# Patient Record
Sex: Male | Born: 1995 | Race: White | Hispanic: No | Marital: Single | Smoking: Never smoker
Health system: Southern US, Community
[De-identification: ages and names within clinical notes are randomized; demographics above are authoritative.]

## PROBLEM LIST (undated history)

## (undated) DIAGNOSIS — F845 Asperger's syndrome: Secondary | ICD-10-CM

## (undated) DIAGNOSIS — F32A Depression, unspecified: Secondary | ICD-10-CM

## (undated) DIAGNOSIS — F329 Major depressive disorder, single episode, unspecified: Secondary | ICD-10-CM

## (undated) DIAGNOSIS — J302 Other seasonal allergic rhinitis: Secondary | ICD-10-CM

## (undated) DIAGNOSIS — F419 Anxiety disorder, unspecified: Secondary | ICD-10-CM

## (undated) HISTORY — PX: WISDOM TOOTH EXTRACTION: SHX21

---
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
## 2014-05-19 ENCOUNTER — Other Ambulatory Visit: Payer: Self-pay | Admitting: Family Medicine

## 2014-05-19 DIAGNOSIS — E041 Nontoxic single thyroid nodule: Secondary | ICD-10-CM

## 2014-06-04 ENCOUNTER — Ambulatory Visit
Admission: RE | Admit: 2014-06-04 | Discharge: 2014-06-04 | Disposition: A | Discharge status: Home / Self Care | Payer: Managed Care, Other (non HMO) | Source: Ambulatory Visit | Attending: Family Medicine | Admitting: Family Medicine

## 2014-06-04 DIAGNOSIS — E041 Nontoxic single thyroid nodule: Secondary | ICD-10-CM

## 2014-08-21 ENCOUNTER — Encounter (HOSPITAL_COMMUNITY): Payer: Self-pay

## 2014-08-21 ENCOUNTER — Emergency Department (HOSPITAL_COMMUNITY)
Admission: EM | Admit: 2014-08-21 | Discharge: 2014-08-23 | Disposition: A | Discharge status: Other Acute Inpatient Hospital | Payer: Managed Care, Other (non HMO) | Attending: Emergency Medicine | Admitting: Emergency Medicine

## 2014-08-21 DIAGNOSIS — F418 Other specified anxiety disorders: Secondary | ICD-10-CM | POA: Insufficient documentation

## 2014-08-21 DIAGNOSIS — F419 Anxiety disorder, unspecified: Secondary | ICD-10-CM | POA: Diagnosis not present

## 2014-08-21 DIAGNOSIS — F845 Asperger's syndrome: Secondary | ICD-10-CM | POA: Insufficient documentation

## 2014-08-21 DIAGNOSIS — F329 Major depressive disorder, single episode, unspecified: Secondary | ICD-10-CM | POA: Diagnosis not present

## 2014-08-21 DIAGNOSIS — N508 Other specified disorders of male genital organs: Secondary | ICD-10-CM | POA: Diagnosis not present

## 2014-08-21 DIAGNOSIS — F22 Delusional disorders: Secondary | ICD-10-CM | POA: Diagnosis not present

## 2014-08-21 DIAGNOSIS — R4689 Other symptoms and signs involving appearance and behavior: Secondary | ICD-10-CM | POA: Insufficient documentation

## 2014-08-21 DIAGNOSIS — Z8659 Personal history of other mental and behavioral disorders: Secondary | ICD-10-CM

## 2014-08-21 DIAGNOSIS — F32A Depression, unspecified: Secondary | ICD-10-CM | POA: Diagnosis present

## 2014-08-21 HISTORY — DX: Anxiety disorder, unspecified: F41.9

## 2014-08-21 HISTORY — DX: Other seasonal allergic rhinitis: J30.2

## 2014-08-21 HISTORY — DX: Depression, unspecified: F32.A

## 2014-08-21 HISTORY — DX: Asperger's syndrome: F84.5

## 2014-08-21 HISTORY — DX: Major depressive disorder, single episode, unspecified: F32.9

## 2014-08-21 LAB — URINALYSIS, ROUTINE W REFLEX MICROSCOPIC
BILIRUBIN URINE: NEGATIVE
Glucose, UA: NEGATIVE mg/dL
Hgb urine dipstick: NEGATIVE
Ketones, ur: NEGATIVE mg/dL
LEUKOCYTES UA: NEGATIVE
Nitrite: NEGATIVE
PH: 6.5 (ref 5.0–8.0)
Protein, ur: NEGATIVE mg/dL
Specific Gravity, Urine: 1.02 (ref 1.005–1.030)
UROBILINOGEN UA: 0.2 mg/dL (ref 0.0–1.0)

## 2014-08-21 NOTE — ED Notes (Addendum)
Pt has been camping with dad and family and reports that since the 5th he noticed the left testicle has become detached and the right side looks damaged. Took some advil yesterday and it helped. Mom wants him evaluated for psych. States there are some other things going on. Pt states he has been having problems with urinary issues for a month but didn't want to say anything. When it hurts he tries to forget about it. Pt has asperger's syndrome.

## 2014-08-21 NOTE — ED Notes (Signed)
Spoke with mother, she states there is nothing wrong with his testicles but wants him to be validated, he has been saying off the wall things since he got back from camp. States he has seen a new psychologist and new psychiatrist in May and was started on new medications but doesn't want to take them and they are concerned. States he told her his room is toxic and he called 911 and talked to a cop about it, which didn't really happen. She got really upset.

## 2014-08-21 NOTE — ED Notes (Signed)
TTS in progress 

## 2014-08-21 NOTE — BH Assessment (Signed)
Received notification of TTS consult request. Spoke to Tery SanfilippoMatthew Riester, MD who said Pt has a psychiatric history and appears to be having delusional thoughts. Tele-assessment will be initiated.  Harlin RainFord Ellis Patsy BaltimoreWarrick Jr, LPC, [facility], [facility] Triage Specialist [phone]

## 2014-08-21 NOTE — ED Provider Notes (Signed)
CSN: [id number]     Arrival date & time 08/21/14  1632 History   First MD Initiated Contact with Patient 08/21/14 1646     Chief Complaint  Patient presents with  . Testicle Pain  . Psychiatric Evaluation    per mother- please see RN notes    HPI Pt is a 19 yo male with a hx of anxiety, depression, and asperger syndrome presenting with testicular pain and concern for delusional thoughts per the parents.   Pt reports having left sided testicle pain and anejaculation for the past three weeks.  Parents report chronic masturbation and pt endorses no sexual contact with no hx of STI, penile discharge, or irritation otherwise.  Denies trauma to the testicles.  Denies fevers, chills, n/v, or abdominal pain.  Mother and father report pt has become delusional and is supposed to be on medications but refuses them.  Pt believes police came to his house and told him his room was toxic and how to clean his room.  Mother reports no such incident occurred.  Pt also believes a Public affairs consultant" has contacted him multiple times and told him he has cancer covering his body.  Mother worried at this point he could be a potential danger to other siblings.  Denies suicidal plan but endorses thoughts to harm others and thoughts of harm to self.  Endorses AVH but will not disclose hallucinations.  Denies drug abuse.   Past Medical History  Diagnosis Date  . Seasonal allergies   . Asperger syndrome   . Anxiety   . Depression    Past Surgical History  Procedure Laterality Date  . Wisdom tooth extraction     No family history on file. History  Substance Use Topics  . Smoking status: Never Smoker   . Smokeless tobacco: Not on file  . Alcohol Use: No    Review of Systems  Constitutional: Negative for fever and chills.  HENT: Negative for congestion and sore throat.   Eyes: Negative for pain.  Respiratory: Negative for cough and shortness of breath.   Cardiovascular: Negative for chest pain and palpitations.   Gastrointestinal: Negative for nausea, vomiting, abdominal pain and diarrhea.  Endocrine: Negative.   Genitourinary: Positive for testicular pain. Negative for dysuria, frequency, hematuria, flank pain, decreased urine volume, discharge, scrotal swelling, difficulty urinating, genital sores and penile pain.  Musculoskeletal: Negative for back pain and neck pain.  Skin: Negative for rash.  Allergic/Immunologic: Negative.   Neurological: Negative for dizziness, syncope and light-headedness.  Psychiatric/Behavioral: Positive for suicidal ideas. Negative for confusion and self-injury. The patient is nervous/anxious.       Allergies  Review of patient's allergies indicates no known allergies.  Home Medications   Prior to Admission medications   Medication Sig Start Date End Date Taking? Authorizing Provider  cetirizine (ZYRTEC) 10 MG tablet Take 10 mg by mouth daily as needed for allergies.   Yes Historical Provider, MD  tretinoin (RETIN-A) 0.025 % cream Apply 1 application topically at bedtime.  07/28/14   Historical Provider, MD   BP 106/77 mmHg  Pulse 82  Temp(Src) 97.8 F (36.6 C) (Oral)  Resp 16  Ht  (1.575 m)  Wt 105 lb (47.628 kg)  BMI 19.20 kg/m2  SpO2 100% Physical Exam  Constitutional: He is oriented to person, place, and time. He appears well-developed and well-nourished.  HENT:  Head: Normocephalic and atraumatic.  Eyes: Conjunctivae and EOM are normal. Pupils are equal, round, and reactive to light.  Neck: Normal range  of motion. Neck supple.  Cardiovascular: Normal rate, regular rhythm, normal heart sounds and intact distal pulses.   Pulmonary/Chest: Effort normal and breath sounds normal. No respiratory distress.  Abdominal: Soft. Bowel sounds are normal. There is no tenderness. Hernia confirmed negative in the right inguinal area and confirmed negative in the left inguinal area.  Genitourinary: Testes normal. Cremasteric reflex is present. Right testis shows  no mass, no swelling and no tenderness. Right testis is descended. Cremasteric reflex is not absent on the right side. Left testis shows no mass, no swelling and no tenderness. Left testis is descended. Cremasteric reflex is not absent on the left side. No penile erythema or penile tenderness. No discharge found.  Musculoskeletal: Normal range of motion.  Neurological: He is alert and oriented to person, place, and time. He has normal reflexes. No cranial nerve deficit.  Skin: Skin is warm and dry.  Psychiatric: His behavior is normal. His mood appears anxious. His speech is tangential. Thought content is delusional. Cognition and memory are normal. He expresses impulsivity and inappropriate judgment. He expresses no homicidal and no suicidal ideation. He expresses no suicidal plans and no homicidal plans.    ED Course  Procedures (including critical care time) Labs Review Labs Reviewed  URINE CULTURE  URINALYSIS, ROUTINE W REFLEX MICROSCOPIC (NOT AT [facility])    Imaging Review No results found.   EKG Interpretation None      MDM   Final diagnoses:  None    Pt is a 19 yo male with anxiety and aspergers presenting for testicular pain and concern for mental health.   On evaluation pt HDS in NAD.  Penis and testicles evaluated with no swelling, ulcerations, signs of trauma or tenderness.  Cremaster intact with no tenderness of epididymus.  Doubt STI, epididymitis, torsion, hydrocele, hernia.  Possible retrograde ejaculation due to symptoms of anejaculation with masturbation.  UA with no frank blood or signs of infection.  If symptoms continue may need evaluation by urology.    Pt with frank delusions on exam and concern for potential harm to siblings from mother.  SI and HI but no frank plans.  Discussed on phone with psych who recommended inpatient therapy and starting of medications.  Pt continued in the ED while awaiting placement.    If performed, labs, EKGs, and imaging were  reviewed/interpreted by myself and my attending and incorporated into medical decision making.  Discussed pertinent finding with patient or caregiver prior to admission with no further questions.  Pt care supervised by my attending Dr. Trecia RogersLockwood  Laelynn Blizzard, MD PGY-2  Emergency Medicine         Tery SanfilippoMatthew Bren Steers, MD 08/22/14 [id number]  Gerhard Munchobert Lockwood, MD 08/23/14 1807

## 2014-08-21 NOTE — ED Notes (Signed)
WANDED BY SECURITY AT THIS TIME

## 2014-08-21 NOTE — ED Notes (Signed)
Patient changing into wine colored scrubs and going to room C22.  Instructions regarding Pod C and visiting times given to Mother and Father.  Patient has been walking around in the room without difficulty.  Patient alert and understands we need to do all this to get him help.  Father and son agree on needing help.

## 2014-08-21 NOTE — BH Assessment (Addendum)
Tele Assessment Note   Jack Roy is an 19 y.o. male, single, white who presents to [facility] accompanied by both parents, who participated in assessment at [name] request. [name] parents report Pt has been diagnosed with Asperger's syndrome, anxiety and depression. Pt states he came to the ED because he was concerned his testicle had become detached. Pt describes having various somatic concerns  including that he has cancer throughout his body, that there is something wrong with his genitals, that his skull is cracked and that he has been exposed to toxins. Pt is concerned that he masturbates frequently and conflicted and worried about this. Pt says he called 911 and police came to the family home and told Pt there are toxins in the house, which parents say never happened. Pt also states that earlier today he threatened to kill himself with a knife, which mother states she is not sure actually happened. Pt acknowledges he has been very anxious, worried and "paranoid." Pt recently went on a family camping trip with a lot of people and says people told him things that disturbed him and things he wasn't sure he believed, such as there is going to be a video game which he will help create. Pt states he has been confused and sometimes doesn't know what is real. [name] parents report he has been pacing and talking to himself. Pt states he has frequent wide mood swings where he feels "scare and depressed" on moment and "happy or angry" the next. He reports crying spells, social withdrawal, irritability and frequently feeling fearful. He denies sleep problems but does say he has a problems with bed-wetting. He denies current suicidal ideation but acknowledged past suicidal thoughts with no intent. [name] parents say that Pt has verbally threatened suicide in the past but they do not think he would act on these thoughts and he makes these statement to emphasize how upset he is. Pt denies homicidal ideation and has no  history of violence. Pt denies auditory or visual hallucinations. Pt denies alcohol or substance abuse.  Pt lives with his parents, who are separated, a younger brother and an older sister. Pt reports he has frequently conflicts with with siblings. Pt is currently receiving outpatient medication management with Jack Oddi, PA-C and has received therapy in the past with Jack Roy, [facility] with [facility]. Parents and Pt cannot remember the names of the medications because Pt refuses to take them. Pt has not taken any psychiatric medications since May 2016. Pt took psychiatric medications throughout high school, from which he graduated, but recently has fear they would adversely affect him. Pt has no history of inpatient psychiatric treatment. Parents report a paternal family history of bipolar disorder, anxiety and depression.  Pt is dressed in hospital scrubs, alert, oriented x4 with soft speech and restless, fidgeting motor behavior. Eye contact is good. [name] mood is anxious, depressed and guilty; affect is congruent with mood. Thought process is coherent and circumstantial. Pt appears to have poor reality testing which is no baseline for Pt according to parents. Pt was cooperative and pleasant throughout assessment. Pt states he is willing to take psychiatric medications at this time and also willing to sign voluntarily into a psychiatric hospital as long as his parents are involved in treatment. [name] parents say the feel Pt would benefit from inpatient treatment.   Axis I: Unspecified Anxiety Disorder; Autism Spectrum Disorder Axis II: Deferred Axis III:  Past Medical History  Diagnosis Date  . Seasonal allergies   .  Asperger syndrome   . Anxiety   . Depression    Axis IV: other psychosocial or environmental problems Axis V: GAF=40  Past Medical History:  Past Medical History  Diagnosis Date  . Seasonal allergies   . Asperger syndrome   . Anxiety   . Depression     Past  Surgical History  Procedure Laterality Date  . Wisdom tooth extraction      Family History: No family history on file.  Social History:  reports that he has never smoked. He does not have any smokeless tobacco history on file. He reports that he does not drink alcohol or use illicit drugs.  Additional Social History:  Alcohol / Drug Use Pain Medications: Denies abuse Prescriptions: See MAR Over the Counter: Denies abuse History of alcohol / drug use?: No history of alcohol / drug abuse Longest period of sobriety (when/how long): NA  CIWA: CIWA-Ar BP: 125/69 mmHg Pulse Rate: 119 COWS:    PATIENT STRENGTHS: (choose at least two) Ability for insight Average or above average intelligence Metallurgist fund of knowledge Motivation for treatment/growth Physical Health Supportive family/friends  Allergies: No Known Allergies  Home Medications:  (Not in a hospital admission)  OB/GYN Status:  No LMP for male patient.  General Assessment Data Location of Assessment: [facility] ED TTS Assessment: In system Is this a Tele or Face-to-Face Assessment?: Tele Assessment Is this an Initial Assessment or a Re-assessment for this encounter?: Initial Assessment Marital status: Single Maiden name: NA Is patient pregnant?: No Pregnancy Status: No Living Arrangements: Parent, Other (Comment) (Parents, younger brother, older sister) Can pt return to current living arrangement?: Yes Admission Status: Voluntary Is patient capable of signing voluntary admission?: Yes Referral Source: Self/Family/Friend Insurance type: Community education officer     Crisis Care Plan Living Arrangements: Parent, Other (Comment) (Parents, younger brother, older sister) Name of Psychiatrist: Tamela Roy at [facility] Name of Therapist: "Jack Roy"  Education Status Is patient currently in school?: No Current Grade: NA Highest grade of school patient has completed: 12 Name of school: [employer] person: NA  Risk to self with the past 6 months Suicidal Ideation: No (Pt states he threatened to stab himself with a knife today) Has patient been a risk to self within the past 6 months prior to admission? : No Suicidal Intent: No Has patient had any suicidal intent within the past 6 months prior to admission? : Yes Is patient at risk for suicide?: No Suicidal Plan?: No Has patient had any suicidal plan within the past 6 months prior to admission? : Yes Access to Means: No What has been your use of drugs/alcohol within the last 12 months?: None Previous Attempts/Gestures: No How many times?: 0 Other Self Harm Risks: None identified Triggers for Past Attempts: None known Intentional Self Injurious Behavior: None Family Suicide History: No Recent stressful life event(s): Other (Comment) Biomedical scientist on camping trip) Persecutory voices/beliefs?: No Depression: Yes Depression Symptoms: Despondent, Tearfulness, Isolating, Guilt, Feeling angry/irritable Substance abuse history and/or treatment for substance abuse?: No Suicide prevention information given to non-admitted patients: Yes  Risk to Others within the past 6 months Homicidal Ideation: No Does patient have any lifetime risk of violence toward others beyond the six months prior to admission? : No Thoughts of Harm to Others: No Current Homicidal Intent: No Current Homicidal Plan: No Access to Homicidal Means: No Identified Victim: None History of harm to others?: No Assessment of Violence: None Noted Violent Behavior Description: None Does patient  have access to weapons?: No Criminal Charges Pending?: No Does patient have a court date: No Is patient on probation?: No  Psychosis Hallucinations: None noted Delusions: Somatic (See assessment note)  Mental Status Report Appearance/Hygiene: In scrubs Eye Contact: Fair Motor Activity: Restlessness Speech: Soft Level of Consciousness: Alert Mood: Anxious,  Depressed, Guilty Affect: Anxious Anxiety Level: Severe Thought Processes: Circumstantial, Coherent Judgement: Partial Orientation: Person, Place, Time, Situation, Appropriate for developmental age Obsessive Compulsive Thoughts/Behaviors: None  Cognitive Functioning Concentration: Normal Memory: Recent Intact, Remote Intact IQ: Average Insight: Poor Impulse Control: Fair Appetite: Good Weight Loss: 0 Weight Gain: 0 Sleep: No Change Total Hours of Sleep: 8 Vegetative Symptoms: None  ADLScreening [facility] Assessment Services) Patient's cognitive ability adequate to safely complete daily activities?: Yes Patient able to express need for assistance with ADLs?: Yes Independently performs ADLs?: Yes (appropriate for developmental age)  Prior Inpatient Therapy Prior Inpatient Therapy: No Prior Therapy Dates: NA Prior Therapy Facilty/Provider(s): NA Reason for Treatment: NA  Prior Outpatient Therapy Prior Outpatient Therapy: Yes Prior Therapy Dates: Current Prior Therapy Facilty/Provider(s): [facility] Reason for Treatment: Anxiety, depression Does patient have an ACCT team?: No Does patient have Intensive In-House Services?  : No Does patient have [facility] services? : No Does patient have P4CC services?: No  ADL Screening (condition at time of admission) Patient's cognitive ability adequate to safely complete daily activities?: Yes Is the patient deaf or have difficulty hearing?: No Does the patient have difficulty seeing, even when wearing glasses/contacts?: No Does the patient have difficulty concentrating, remembering, or making decisions?: No Patient able to express need for assistance with ADLs?: Yes Does the patient have difficulty dressing or bathing?: No Independently performs ADLs?: Yes (appropriate for developmental age) Does the patient have difficulty walking or climbing stairs?: No Weakness of Legs: None Weakness of Arms/Hands: None  Home Assistive  Devices/Equipment Home Assistive Devices/Equipment: None    Abuse/Neglect Assessment (Assessment to be complete while patient is alone) Physical Abuse: Denies Verbal Abuse: Denies Sexual Abuse: Denies Exploitation of patient/patient's resources: Denies Self-Neglect: Denies     Merchant navy officerAdvance Directives (For Healthcare) Does patient have an advance directive?: No Would patient like information on creating an advanced directive?: No - patient declined information    Additional Information 1:1 In Past 12 Months?: No CIRT Risk: No Elopement Risk: No Does patient have medical clearance?: Yes     Disposition: Consulted with Alberteen SamFran Hobson, NP who said Pt meets criteria for inpatient psychiatric crisis stabilization. Given [name] age and autism he would be better [facility] a [facility] and TTS will contact other facilities for placement. Notified Tery SanfilippoMatthew Riester, MD and Britta MccreedyBarbara, RN of recommendation. Discussed recommendation with Pt and [name] parents and they agree with recommendation. The Pt and parents also understand that placement may not be secure tonight and that Pt may need to wait overnight in the ED.  Disposition Initial Assessment Completed for this Encounter: Yes Disposition of Patient: Other dispositions Other disposition(s): Other (Comment)   Pamalee LeydenFord Ellis Brees Hounshell Jr, [facility], [facility], [facility] Triage Specialist [phone]   Pamalee LeydenWarrick Jr, Shonteria Abeln Ellis 08/21/2014 8:20 PM

## 2014-08-21 NOTE — ED Notes (Signed)
Belongings placed in utility room. One shirt, shorts, and sweatshirt. NO VALUABLES.

## 2014-08-22 DIAGNOSIS — F329 Major depressive disorder, single episode, unspecified: Secondary | ICD-10-CM | POA: Diagnosis not present

## 2014-08-22 DIAGNOSIS — F32A Depression, unspecified: Secondary | ICD-10-CM | POA: Diagnosis present

## 2014-08-22 DIAGNOSIS — Z8659 Personal history of other mental and behavioral disorders: Secondary | ICD-10-CM

## 2014-08-22 DIAGNOSIS — F419 Anxiety disorder, unspecified: Secondary | ICD-10-CM

## 2014-08-22 DIAGNOSIS — F845 Asperger's syndrome: Secondary | ICD-10-CM | POA: Diagnosis not present

## 2014-08-22 LAB — COMPREHENSIVE METABOLIC PANEL
ALK PHOS: 65 U/L (ref 38–126)
ALT: 19 U/L (ref 17–63)
AST: 18 U/L (ref 15–41)
Albumin: 4.4 g/dL (ref 3.5–5.0)
Anion gap: 10 (ref 5–15)
BILIRUBIN TOTAL: 0.6 mg/dL (ref 0.3–1.2)
BUN: 15 mg/dL (ref 6–20)
CALCIUM: 9.9 mg/dL (ref 8.9–10.3)
CO2: 24 mmol/L (ref 22–32)
Chloride: 106 mmol/L (ref 101–111)
Creatinine, Ser: 0.94 mg/dL (ref 0.61–1.24)
GLUCOSE: 85 mg/dL (ref 65–99)
POTASSIUM: 3.9 mmol/L (ref 3.5–5.1)
Sodium: 140 mmol/L (ref 135–145)
Total Protein: 7.6 g/dL (ref 6.5–8.1)

## 2014-08-22 LAB — CBG MONITORING, ED: Glucose-Capillary: 87 mg/dL (ref 65–99)

## 2014-08-22 LAB — ETHANOL: Alcohol, Ethyl (B): <5 mg/dL (ref <5)

## 2014-08-22 LAB — SALICYLATE LEVEL: Salicylate Lvl: <4 mg/dL (ref 2.8–30.0)

## 2014-08-22 LAB — CBC
HEMATOCRIT: 44.6 % (ref 39.0–52.0)
Hemoglobin: 16.3 g/dL (ref 13.0–17.0)
MCH: 32 pg (ref 26.0–34.0)
MCHC: 36.5 g/dL — AB (ref 30.0–36.0)
MCV: 87.6 fL (ref 78.0–100.0)
Platelets: 184 10*3/uL (ref 150–400)
RBC: 5.09 MIL/uL (ref 4.22–5.81)
RDW: 12.2 % (ref 11.5–15.5)
WBC: 6.2 10*3/uL (ref 4.0–10.5)

## 2014-08-22 LAB — ACETAMINOPHEN LEVEL: Acetaminophen (Tylenol), Serum: <10 ug/mL — ABNORMAL LOW (ref 10–30)

## 2014-08-22 MED ORDER — RISPERIDONE 1 MG PO TBDP
1.0000 mg | ORAL_TABLET | Freq: Once | ORAL | Status: AC
  Administered 2014-08-22: 1 mg via ORAL
  Filled 2014-08-22: qty 1

## 2014-08-22 NOTE — ED Notes (Signed)
[facility] - [phone].

## 2014-08-22 NOTE — ED Notes (Signed)
Per Renata Capriceonrad, NP, [facility], plan is to start pt on Risperdal, monitor results and look for placement at this time d/t mother had reported pt had voiced wanting to have "sex" w/his sister.

## 2014-08-22 NOTE — ED Notes (Addendum)
PER DEIDRA, [facility], PT [facility]. AWARE CONRAD, NP, [facility], HAS RECOMMENDED FOR PT TO BE IVC'D. DEIDRA ADVISED OK FOR PT TO BE TRANSPORTED IN AM IF [facility] DEPUTY UNABLE TO DO SO THIS EVENING AFTER PT HAS BEEN SERVED. DEIDRA REQUESTED FOR IVC PAPERS TO BE FAXED TO [facility] - [phone] WHEN SERVED. REPORT CAN BE CALLED TO [phone]. LEFT MESSAGE ON VM OF S[facility]AL W/[place] [facility] OFFICE ADVISING PT WILL NEED TO BE TRANSPORTED AFTER IVC PAPERWORK HAS BEEN SERVED. ADVISED STAFFING OFFICE OF NEED FOR SITTER.

## 2014-08-22 NOTE — ED Notes (Signed)
Patient up walking around in room at this time.

## 2014-08-22 NOTE — Consult Note (Signed)
Telepsych Consultation   Reason for Consult:  Obsessions, rumination, paranoid ideation, Asperger's exacerbation Referring Physician:  EDP Patient Identification: Jack Roy MRN:  [id number] Principal Diagnosis: Anxiety and depression Diagnosis:   Patient Active Problem List   Diagnosis Date Noted  . Anxiety and depression [F41.8] 08/22/2014    Priority: High  . History of Asperger's syndrome [Z86.59] 08/22/2014    Priority: High    Total Time spent with patient: 25 minutes  Subjective:   Jack Roy is a 19 y.o. male patient admitted with reports of obsessions about his testicles, toxins in the air, and the cleanliness of the house. Pt has reportedly refused his psychotropic medications (Tegretol and Lexapro). Pt has worsened since then.   Pt seen and chart reviewed. Pt reports that is is worried about the above factors. However, pt denies suicidal/homicidal ideation and psychosis. He does not appear to be responding to internal stimuli. However, he seems very anxious and asked about his testicular pain multiple times during the assessment.  Collateral from mother includes that pt is on Lexapro and Tegretol from a new provider because they could not get into an appointment with their previous provider, Dr. Jannifer Franklin. Mother reports chronic excessive masturbation. She reports that he dressed up one day and her 12-yr old daughter expressed concern that pt stated he "wanted to dress up so he can have sex" with his sister. She also reports that he is continues to exhibit paranoid ideation in that he has multiple somatic complaints about various things such as chest pain, testicular pain, "toxins in the air" and "poison in the food". Pt confirmed this when asked.  Pt was given a 1-time dose of Risperdal M-tabs 1mg  sublingual tab with no improvement in status. [name] mother is very concerned that she can no longer control him and that this made no change in his attitude or behavior when  she came to see him 3-4 hours later.  Due to pt's refusal to take medications at home, his increasing fixation on concerning topics such as inappropriately sexual endeavors with his 66 year old sister, delusional manifestations of fixation on hypochondriasis with no organic etiology, and history of running away when the car door opens at psychiatry appointments, pt warrants inpatient admission for safety and stabilization and must be involuntarily committed due to potential flight risk by history.    HPI:  Jack Roy is an 19 y.o. male, single, white who presents to [facility] accompanied by both parents, who participated in assessment at [name] request. [name] parents report Pt has been diagnosed with Asperger's syndrome, anxiety and depression. Pt states he came to the ED because he was concerned his testicle had become detached. Pt describes having various somatic concerns including that he has cancer throughout his body, that there is something wrong with his genitals, that his skull is cracked and that he has been exposed to toxins. Pt is concerned that he masturbates frequently and conflicted and worried about this. Pt says he called 911 and police came to the family home and told Pt there are toxins in the house, which parents say never happened. Pt also states that earlier today he threatened to kill himself with a knife, which mother states she is not sure actually happened. Pt acknowledges he has been very anxious, worried and "paranoid." Pt recently went on a family camping trip with a lot of people and says people told him things that disturbed him and things he wasn't sure he believed, such as there is going to be  a video game which he will help create. Pt states he has been confused and sometimes doesn't know what is real. [name] parents report he has been pacing and talking to himself. Pt states he has frequent wide mood swings where he feels "scare and depressed" on moment and "happy or angry"  the next. He reports crying spells, social withdrawal, irritability and frequently feeling fearful. He denies sleep problems but does say he has a problems with bed-wetting. He denies current suicidal ideation but acknowledged past suicidal thoughts with no intent. [name] parents say that Pt has verbally threatened suicide in the past but they do not think he would act on these thoughts and he makes these statement to emphasize how upset he is. Pt denies homicidal ideation and has no history of violence. Pt denies auditory or visual hallucinations. Pt denies alcohol or substance abuse.  Pt lives with his parents, who are separated, a younger brother and an older sister. Pt reports he has frequently conflicts with with siblings. Pt is currently receiving outpatient medication management with Tamela Oddi, PA-C and has received therapy in the past with Jory Ee, [facility] with [facility]. Parents and Pt cannot remember the names of the medications because Pt refuses to take them. Pt has not taken any psychiatric medications since May 2016. Pt took psychiatric medications throughout high school, from which he graduated, but recently has fear they would adversely affect him. Pt has no history of inpatient psychiatric treatment. Parents report a paternal family history of bipolar disorder, anxiety and depression.  Pt is dressed in hospital scrubs, alert, oriented x4 with soft speech and restless, fidgeting motor behavior. Eye contact is good. [name] mood is anxious, depressed and guilty; affect is congruent with mood. Thought process is coherent and circumstantial. Pt appears to have poor reality testing which is no baseline for Pt according to parents. Pt was cooperative and pleasant throughout assessment. Pt states he is willing to take psychiatric medications at this time and also willing to sign voluntarily into a psychiatric hospital as long as his parents are involved in treatment. [name] parents say the feel Pt  would benefit from inpatient treatment.  Past Medical History:  Past Medical History  Diagnosis Date  . Seasonal allergies   . Asperger syndrome   . Anxiety   . Depression     Past Surgical History  Procedure Laterality Date  . Wisdom tooth extraction     Family History: No family history on file. Social History:  History  Alcohol Use No     History  Drug Use No    History   Social History  . Marital Status: Single    Spouse Name: N/A  . Number of Children: N/A  . Years of Education: N/A   Social History Main Topics  . Smoking status: Never Smoker   . Smokeless tobacco: Not on file  . Alcohol Use: No  . Drug Use: No  . Sexual Activity: Not on file   Other Topics Concern  . None   Social History Narrative  . None   Additional Social History:    Pain Medications: Denies abuse Prescriptions: See MAR Over the Counter: Denies abuse History of alcohol / drug use?: No history of alcohol / drug abuse Longest period of sobriety (when/how long): NA                     Allergies:  No Known Allergies  Labs:  Results for orders placed or performed  during the hospital encounter of 08/21/14 (from the past 48 hour(s))  Urinalysis, Routine w reflex microscopic (not at [facility])     Status: None   Collection Time: 08/21/14  5:55 PM  Result Value Ref Range   Color, Urine YELLOW YELLOW   APPearance CLEAR CLEAR   Specific Gravity, Urine 1.020 1.005 - 1.030   pH 6.5 5.0 - 8.0   Glucose, UA NEGATIVE NEGATIVE mg/dL   Hgb urine dipstick NEGATIVE NEGATIVE   Bilirubin Urine NEGATIVE NEGATIVE   Ketones, ur NEGATIVE NEGATIVE mg/dL   Protein, ur NEGATIVE NEGATIVE mg/dL   Urobilinogen, UA 0.2 0.0 - 1.0 mg/dL   Nitrite NEGATIVE NEGATIVE   Leukocytes, UA NEGATIVE NEGATIVE    Comment: MICROSCOPIC NOT DONE ON URINES WITH NEGATIVE PROTEIN, BLOOD, LEUKOCYTES, NITRITE, OR GLUCOSE <1000 mg/dL.    Vitals: Blood pressure 93/57, pulse 78, temperature 98 F (36.7 C), temperature  source Oral, resp. rate 16, height 5\' 2"  (1.575 m), weight 47.628 kg (105 lb), SpO2 99 %.  Risk to Self: Suicidal Ideation: No (Pt states he threatened to stab himself with a knife today) Suicidal Intent: No Is patient at risk for suicide?: No Suicidal Plan?: No Access to Means: No What has been your use of drugs/alcohol within the last 12 months?: None How many times?: 0 Other Self Harm Risks: None identified Triggers for Past Attempts: None known Intentional Self Injurious Behavior: None Risk to Others: Homicidal Ideation: No Thoughts of Harm to Others: No Current Homicidal Intent: No Current Homicidal Plan: No Access to Homicidal Means: No Identified Victim: None History of harm to others?: No Assessment of Violence: None Noted Violent Behavior Description: None Does patient have access to weapons?: No Criminal Charges Pending?: No Does patient have a court date: No Prior Inpatient Therapy: Prior Inpatient Therapy: No Prior Therapy Dates: NA Prior Therapy Facilty/Provider(s): NA Reason for Treatment: NA Prior Outpatient Therapy: Prior Outpatient Therapy: Yes Prior Therapy Dates: Current Prior Therapy Facilty/Provider(s): [facility] Reason for Treatment: Anxiety, depression Does patient have an ACCT team?: No Does patient have Intensive In-House Services?  : No Does patient have [facility] services? : No Does patient have P4CC services?: No  No current facility-administered medications for this encounter.   Current Outpatient Prescriptions  Medication Sig Dispense Refill  . cetirizine (ZYRTEC) 10 MG tablet Take 10 mg by mouth daily as needed for allergies.    Marland Kitchen tretinoin (RETIN-A) 0.025 % cream Apply 1 application topically at bedtime.      Musculoskeletal:  UTO, camera   Psychiatric Specialty Exam: Physical Exam  Review of Systems  Musculoskeletal: Positive for myalgias (testicular pain; evaluated by ED and unremarkable).  Psychiatric/Behavioral: Positive for  hallucinations (delusions). Negative for suicidal ideas. The patient is nervous/anxious.   All other systems reviewed and are negative.   Blood pressure 93/57, pulse 78, temperature 98 F (36.7 C), temperature source Oral, resp. rate 16, height 5\' 2"  (1.575 m), weight 47.628 kg (105 lb), SpO2 99 %.Body mass index is 19.2 kg/(m^2).  General Appearance: Bizarre and Fairly Groomed  Patent attorney::  Poor  Speech:  Clear and Coherent and Pressured  Volume:  Increased  Mood:  Anxious and Irritable  Affect:  Non-Congruent, Inappropriate and Labile  Thought Process:  Circumstantial, Loose and Tangential  Orientation:  Full (Time, Place, and Person)  Thought Content:  Delusions, Paranoid Ideation and Rumination  Suicidal Thoughts:  No  Homicidal Thoughts:  No  Memory:  Immediate;   Fair Recent;   Fair Remote;  Fair  Judgement:  Impaired  Insight:  Lacking  Psychomotor Activity:  Increased  Concentration:  Fair  Recall:  Fis[employer]Fair  Fund of Knowledge:Fair  Language: Fair  Akathisia:  No  Handed:    AIMS (if indicated):     Assets:  Resilience Social Support  ADL's:  Impaired  Cognition: Impaired,  Moderate  Sleep:      Medical Decision Making: Established Problem, Stable/Improving (1), Review of Psycho-Social Stressors (1), Review or order clinical lab tests (1), Review of Medication Regimen & Side Effects (2) and Review of New Medication or Change in Dosage (2)  Treatment Plan Summary: Anxiety and depression with current Asperger's diagnosis, unstable, warrants inpatient admission.    Plan:  Recommend psychiatric Inpatient admission when medically cleared.  Disposition:  -Admit to inpatient psychiatric hospitalization for safety and stabilization  Beau FannyWithrow, John C, FNP-BC 08/22/2014 10:04 AM  Reviewed case and agree with plan.

## 2014-08-22 NOTE — ED Notes (Signed)
IVC PAPERS SERVED  

## 2014-08-22 NOTE — ED Notes (Signed)
Pt has been ambulating to bathroom and back to room several times this am. Pt noted to lock the door during one episode - RN asked pt to not do so. Pt voiced understanding and stated, "There shouldn't be a lock on the door if you're not supposed to lock it". Pt did as requested.

## 2014-08-22 NOTE — ED Provider Notes (Signed)
Patient has been accepted to an outside facility [facility]). Psych is concerned about patient requests that he be involuntarily committed. There have been some reports that there is concern from family that patient is talking about having sex with his underage sister.  Pricilla LovelessScott Hercules Hasler, MD 08/22/14 [phone]

## 2014-08-22 NOTE — ED Notes (Signed)
IVC paperwork given to Kim,Janan Halter Secretary, for [employer].

## 2014-08-22 NOTE — Progress Notes (Signed)
Disposition CSW completed referrals to the following inpatient psych facilities:  [facility]  CSW will follow patient for their placement needs.  Seward SpeckLeo Tristin Vandeusen [facility]oscopyCSW,LCAS Behavioral Health Disposition CSW [phone]

## 2014-08-22 NOTE — ED Notes (Signed)
Dr Criss AlvineGoldston aware no labs had been ordered/drawn for pt [facility]. Simonne ComeLeo, SW, to call Dagoberto Reefeidra, [facility], to advise labwork being drawn and RN will fax results when available.

## 2014-08-22 NOTE — ED Notes (Signed)
SPOKE Lanette HampshireW/KRISTY, [facility] -- [phone] - ADVISED COPY OF IVC PAPERWORK FAXED TO THEM [phone]- [phone] AND ADVISED PT WILL NOT BE TRANSPORTED UNTIL AM. KRISTY ADVISED OK - REQUESTED FOR REPORT TO BE CALLED IN AM TO [phone].

## 2014-08-22 NOTE — ED Notes (Signed)
[facility] [phone] - requesting if pt is his own legal guardian or if parents are and how is pt going to get there. Simonne ComeLeo, SW, aware and is speaking w/parents.

## 2014-08-22 NOTE — ED Notes (Signed)
[facility] Copy placed in Medical Records.

## 2014-08-22 NOTE — ED Provider Notes (Signed)
Psych recommends 1 mg Risperdal Mtab now to see if this helps patient. Will continue to observe.  Pricilla LovelessScott Aurelie Dicenzo, MD 08/22/14 [phone]

## 2014-08-22 NOTE — ED Notes (Signed)
[facility] REQUESTED TO BE CALLED WHEN RECEIVE IVC PAPERS - [phone].

## 2014-08-22 NOTE — ED Notes (Signed)
Verified w/magistrate received IVC paperwork - Magistrate advised will have [employer] to serve.

## 2014-08-22 NOTE — ED Notes (Signed)
RN CALLED [name] MOTHER, LAURA, AND ADVISED PT BEING TRANSPORTED IN AM TO [facility]. VOICED UNDERSTANDING AND AGREEMENT W/PLAN. ADVISED SHE WILL BE HERE FOR 0830 VISIT IN AM.

## 2014-08-23 LAB — URINE CULTURE: Culture: 9000

## 2014-08-23 NOTE — ED Notes (Signed)
Patient is asleep with sitter at the bedside. 

## 2015-03-04 ENCOUNTER — Encounter (HOSPITAL_COMMUNITY): Payer: Self-pay | Admitting: Emergency Medicine

## 2015-03-04 ENCOUNTER — Emergency Department (HOSPITAL_COMMUNITY)
Admission: EM | Admit: 2015-03-04 | Discharge: 2015-03-05 | Disposition: A | Discharge status: Home / Self Care | Payer: Managed Care, Other (non HMO) | Attending: Emergency Medicine | Admitting: Emergency Medicine

## 2015-03-04 DIAGNOSIS — Z8659 Personal history of other mental and behavioral disorders: Secondary | ICD-10-CM | POA: Insufficient documentation

## 2015-03-04 DIAGNOSIS — R440 Auditory hallucinations: Secondary | ICD-10-CM

## 2015-03-04 DIAGNOSIS — R45851 Suicidal ideations: Secondary | ICD-10-CM | POA: Diagnosis present

## 2015-03-04 LAB — CBC
HCT: 41.3 % (ref 39.0–52.0)
HEMOGLOBIN: 14.5 g/dL (ref 13.0–17.0)
MCH: 31.5 pg (ref 26.0–34.0)
MCHC: 35.1 g/dL (ref 30.0–36.0)
MCV: 89.8 fL (ref 78.0–100.0)
PLATELETS: 209 10*3/uL (ref 150–400)
RBC: 4.6 MIL/uL (ref 4.22–5.81)
RDW: 12.3 % (ref 11.5–15.5)
WBC: 6.5 10*3/uL (ref 4.0–10.5)

## 2015-03-04 LAB — COMPREHENSIVE METABOLIC PANEL
ALT: 11 U/L — ABNORMAL LOW (ref 17–63)
ANION GAP: 11 (ref 5–15)
AST: 16 U/L (ref 15–41)
Albumin: 4.5 g/dL (ref 3.5–5.0)
Alkaline Phosphatase: 61 U/L (ref 38–126)
BUN: 20 mg/dL (ref 6–20)
CHLORIDE: 110 mmol/L (ref 101–111)
CO2: 22 mmol/L (ref 22–32)
Calcium: 9.7 mg/dL (ref 8.9–10.3)
Creatinine, Ser: 1.05 mg/dL (ref 0.61–1.24)
Glucose, Bld: 96 mg/dL (ref 65–99)
POTASSIUM: 3.7 mmol/L (ref 3.5–5.1)
SODIUM: 143 mmol/L (ref 135–145)
Total Bilirubin: 0.6 mg/dL (ref 0.3–1.2)
Total Protein: 7.6 g/dL (ref 6.5–8.1)

## 2015-03-04 LAB — RAPID URINE DRUG SCREEN, HOSP PERFORMED
AMPHETAMINES: NOT DETECTED
BENZODIAZEPINES: NOT DETECTED
Barbiturates: NOT DETECTED
COCAINE: NOT DETECTED
OPIATES: NOT DETECTED
Tetrahydrocannabinol: NOT DETECTED

## 2015-03-04 NOTE — ED Notes (Signed)
Patient is having hallucinations from their pet dog. The pet dog is telling him to do stuff. Patient is not saying what stuff.

## 2015-03-05 DIAGNOSIS — R440 Auditory hallucinations: Secondary | ICD-10-CM | POA: Diagnosis not present

## 2015-03-05 LAB — ACETAMINOPHEN LEVEL

## 2015-03-05 LAB — SALICYLATE LEVEL

## 2015-03-05 LAB — ETHANOL

## 2015-03-05 MED ORDER — LORAZEPAM 1 MG PO TABS
1.0000 mg | ORAL_TABLET | Freq: Three times a day (TID) | ORAL | Status: DC | PRN
  Administered 2015-03-05: 1 mg via ORAL
  Filled 2015-03-05: qty 1

## 2015-03-05 MED ORDER — ONDANSETRON HCL 4 MG PO TABS: 4.0000 mg | ORAL_TABLET | Freq: Three times a day (TID) | ORAL | Status: DC | PRN

## 2015-03-05 MED ORDER — ACETAMINOPHEN 325 MG PO TABS: 650.0000 mg | ORAL_TABLET | ORAL | Status: DC | PRN

## 2015-03-05 NOTE — ED Provider Notes (Signed)
CSN: [id number]     Arrival date & time 03/04/15  2240 History   First MD Initiated Contact with Patient 03/04/15 2330     Chief Complaint  Patient presents with  . Homicidal  . Suicidal     (Consider location/radiation/quality/duration/timing/severity/associated sxs/prior Treatment) HPI Comments: Patient with history of Aspergers, depression, anxiety, followed by [facility], presents with auditory hallucinations the patient refers to as "telepathy" with command to call 9-1-1 for emergency assistance. Per mom who is at bedside, the patient has history of AH, and is undergoing medication adjustments by his psychiatrist in an attempt to establish clear diagnoses. The patient denies SI/HI.   The history is provided by the patient and a parent. No language interpreter was used.    Past Medical History  Diagnosis Date  . Seasonal allergies   . Asperger syndrome   . Anxiety   . Depression    Past Surgical History  Procedure Laterality Date  . Wisdom tooth extraction     History reviewed. No pertinent family history. Social History  Substance Use Topics  . Smoking status: Never Smoker   . Smokeless tobacco: None  . Alcohol Use: No    Review of Systems  Constitutional: Negative for fever and chills.  HENT: Negative.   Respiratory: Negative.   Cardiovascular: Negative.   Gastrointestinal: Negative.   Musculoskeletal: Negative.   Skin: Negative.   Neurological: Negative.   Psychiatric/Behavioral: Positive for hallucinations.      Allergies  Review of patient's allergies indicates no known allergies.  Home Medications   Prior to Admission medications   Medication Sig Start Date End Date Taking? Authorizing Provider  cetirizine (ZYRTEC) 10 MG tablet Take 10 mg by mouth daily as needed for allergies.    Historical Provider, MD  tretinoin (RETIN-A) 0.025 % cream Apply 1 application topically at bedtime.  07/28/14   Historical Provider, MD   BP 116/78 mmHg   Pulse 77  Temp(Src) 98.4 F (36.9 C) (Oral)  Resp 16  SpO2 100% Physical Exam  Constitutional: He is oriented to person, place, and time. He appears well-developed and well-nourished.  HENT:  Head: Normocephalic.  Neck: Normal range of motion. Neck supple.  Cardiovascular: Normal rate and regular rhythm.   Pulmonary/Chest: Effort normal and breath sounds normal.  Abdominal: Soft. Bowel sounds are normal. There is no tenderness. There is no rebound and no guarding.  Musculoskeletal: Normal range of motion.  Neurological: He is alert and oriented to person, place, and time.  Skin: Skin is warm and dry. No rash noted.  Psychiatric: His speech is normal. He is actively hallucinating. He expresses impulsivity.    ED Course  Procedures (including critical care time) Labs Review Labs Reviewed  COMPREHENSIVE METABOLIC PANEL - Abnormal; Notable for the following:    ALT 11 (*)    All other components within normal limits  ACETAMINOPHEN LEVEL - Abnormal; Notable for the following:    Acetaminophen (Tylenol), Serum <10 (*)    All other components within normal limits  ETHANOL  SALICYLATE LEVEL  CBC  URINE RAPID DRUG SCREEN, HOSP PERFORMED    Imaging Review No results found. I have personally reviewed and evaluated these images and lab results as part of my medical decision-making.   EKG Interpretation None      MDM   Final diagnoses:  None    1. Auditory hallucinations  Discussed evaluation by TTS tonight with possibility of inpatient vs outpatient. Will require TTS input to determine disposition.  Elpidio Anis, PA-C 03/05/15 0011  Mancel Bale, MD 03/05/15 1322

## 2015-03-05 NOTE — Consult Note (Signed)
[facility] Consult   Reason for Consult:  Psychiatric Consult Referring Physician:  EDP Patient Identification: Jack Roy MRN:  [id number] Principal Diagnosis: Auditory hallucinations Diagnosis:   Patient Active Problem List   Diagnosis Date Noted  . Auditory hallucinations [R44.0] 03/05/2015  . Anxiety and depression [F41.8] 08/22/2014  . History of Asperger's syndrome [Z86.59] 08/22/2014    Total Time spent with patient: 35 minutes  Subjective:   Jack Roy is a 20 y.o. male patient admitted with auditory hallucinations.Marland Kitchen  HPI:  Jack Roy is a 20 yo Caucasian male who presented to [facility] ED voluntarily for evaluation of auditory hallucinations which he described as his pet dog giving him commands. He does not elaborate on these commands.  Today, he is seen by Dr. Darleene Cleaver and Manus Gunning, NP. His father is present for the evaluation. The patient reports that he continues to hear voices and that the voices tell him not to tell others what he is hearing. He denies suicidal or homicidal ideation, intent or plan. He denies previous suicide attempts.  He is currently on zyprexa, trileptal, and risperdal. His father administers his medication and states the patient is usually compliant with medications except for the past 3 days. He is followed by Eino Farber, PA at [facility]. He denies alcohol or drug use.   Past Psychiatric History: Anxiety, depression, Asperger's  Risk to Self: Suicidal Ideation: No Suicidal Intent: No Is patient at risk for suicide?: No Suicidal Plan?: No Access to Means: No What has been your use of drugs/alcohol within the last 12 months?: Pt denies  How many times?: 0 Other Self Harm Risks: PT denies  Triggers for Past Attempts: None known Intentional Self Injurious Behavior: None Risk to Others: Homicidal Ideation: No Thoughts of Harm to Others: No Current Homicidal Intent: No Current Homicidal Plan: No Access to  Homicidal Means: No Identified Victim: N/A History of harm to others?: No Assessment of Violence: None Noted Violent Behavior Description: No violent behaviors observed.  Does patient have access to weapons?: No Criminal Charges Pending?: No Does patient have a court date: No Prior Inpatient Therapy: Prior Inpatient Therapy: No Prior Outpatient Therapy: Prior Outpatient Therapy: Yes Prior Therapy Dates: Current  Prior Therapy Facilty/Provider(s): [facility]  Reason for Treatment: Medication management  Does patient have an ACCT team?: No Does patient have Intensive In-House Services?  : No Does patient have [facility] services? : No Does patient have P4CC services?: No  Past Medical History:  Past Medical History  Diagnosis Date  . Seasonal allergies   . Asperger syndrome   . Anxiety   . Depression     Past Surgical History  Procedure Laterality Date  . Wisdom tooth extraction     Family History: History reviewed. No pertinent family history. Family Psychiatric  History: Unknown Social History:  History  Alcohol Use No     History  Drug Use No    Social History   Social History  . Marital Status: Single    Spouse Name: N/A  . Number of Children: N/A  . Years of Education: N/A   Social History Main Topics  . Smoking status: Never Smoker   . Smokeless tobacco: None  . Alcohol Use: No  . Drug Use: No  . Sexual Activity: Not Asked   Other Topics Concern  . None   Social History Narrative   Additional Social History:    History of alcohol / drug use?: No history of alcohol / drug abuse  Allergies:  No Known Allergies  Labs:  Results for orders placed or performed during the hospital encounter of 03/04/15 (from the past 48 hour(s))  Comprehensive metabolic panel     Status: Abnormal   Collection Time: 03/04/15 10:57 PM  Result Value Ref Range   Sodium 143 135 - 145 mmol/L   Potassium 3.7 3.5 - 5.1 mmol/L   Chloride 110 101 - 111 mmol/L    CO2 22 22 - 32 mmol/L   Glucose, Bld 96 65 - 99 mg/dL   BUN 20 6 - 20 mg/dL   Creatinine, Ser 1.05 0.61 - 1.24 mg/dL   Calcium 9.7 8.9 - 10.3 mg/dL   Total Protein 7.6 6.5 - 8.1 g/dL   Albumin 4.5 3.5 - 5.0 g/dL   AST 16 15 - 41 U/L   ALT 11 (L) 17 - 63 U/L   Alkaline Phosphatase 61 38 - 126 U/L   Total Bilirubin 0.6 0.3 - 1.2 mg/dL   GFR calc non Af Amer >60 >60 mL/min   GFR calc Af Amer >60 >60 mL/min    Comment: (NOTE) The eGFR has been calculated using the CKD EPI equation. This calculation has not been validated in all clinical situations. eGFR's persistently <60 mL/min signify possible Chronic Kidney Disease.    Anion gap 11 5 - 15  Ethanol (ETOH)     Status: None   Collection Time: 03/04/15 10:57 PM  Result Value Ref Range   Alcohol, Ethyl (B) <5 <5 mg/dL    Comment:        LOWEST DETECTABLE LIMIT FOR SERUM ALCOHOL IS 5 mg/dL FOR MEDICAL PURPOSES ONLY   Salicylate level     Status: None   Collection Time: 03/04/15 10:57 PM  Result Value Ref Range   Salicylate Lvl <0.1 2.8 - 30.0 mg/dL  Acetaminophen level     Status: Abnormal   Collection Time: 03/04/15 10:57 PM  Result Value Ref Range   Acetaminophen (Tylenol), Serum <10 (L) 10 - 30 ug/mL    Comment:        THERAPEUTIC CONCENTRATIONS VARY SIGNIFICANTLY. A RANGE OF 10-30 ug/mL MAY BE AN EFFECTIVE CONCENTRATION FOR MANY PATIENTS. HOWEVER, SOME ARE BEST TREATED AT CONCENTRATIONS OUTSIDE THIS RANGE. ACETAMINOPHEN CONCENTRATIONS >150 ug/mL AT 4 HOURS AFTER INGESTION AND >50 ug/mL AT 12 HOURS AFTER INGESTION ARE OFTEN ASSOCIATED WITH TOXIC REACTIONS.   CBC     Status: None   Collection Time: 03/04/15 10:57 PM  Result Value Ref Range   WBC 6.5 4.0 - 10.5 K/uL   RBC 4.60 4.22 - 5.81 MIL/uL   Hemoglobin 14.5 13.0 - 17.0 g/dL   HCT 41.3 39.0 - 52.0 %   MCV 89.8 78.0 - 100.0 fL   MCH 31.5 26.0 - 34.0 pg   MCHC 35.1 30.0 - 36.0 g/dL   RDW 12.3 11.5 - 15.5 %   Platelets 209 150 - 400 K/uL  Urine rapid drug  screen (hosp performed) (Not at [facility])     Status: None   Collection Time: 03/04/15 11:13 PM  Result Value Ref Range   Opiates NONE DETECTED NONE DETECTED   Cocaine NONE DETECTED NONE DETECTED   Benzodiazepines NONE DETECTED NONE DETECTED   Amphetamines NONE DETECTED NONE DETECTED   Tetrahydrocannabinol NONE DETECTED NONE DETECTED   Barbiturates NONE DETECTED NONE DETECTED    Comment:        DRUG SCREEN FOR MEDICAL PURPOSES ONLY.  IF CONFIRMATION IS NEEDED FOR ANY PURPOSE, NOTIFY LAB WITHIN 5 DAYS.  LOWEST DETECTABLE LIMITS FOR URINE DRUG SCREEN Drug Class       Cutoff (ng/mL) Amphetamine      1000 Barbiturate      200 Benzodiazepine   846 Tricyclics       962 Opiates          300 Cocaine          300 THC              50     Current Facility-Administered Medications  Medication Dose Route Frequency Provider Last Rate Last Dose  . acetaminophen (TYLENOL) tablet 650 mg  650 mg Oral Q4H PRN Charlann Lange, PA-C      . LORazepam (ATIVAN) tablet 1 mg  1 mg Oral Q8H PRN Charlann Lange, PA-C   1 mg at 03/05/15 [id number]  . ondansetron (ZOFRAN) tablet 4 mg  4 mg Oral Q8H PRN Charlann Lange, PA-C       Current Outpatient Prescriptions  Medication Sig Dispense Refill  . OLANZapine (ZYPREXA) 5 MG tablet Take 10 mg by mouth at bedtime.     . Oxcarbazepine (TRILEPTAL) 300 MG tablet Take 300 mg by mouth at bedtime.     . risperiDONE (RISPERDAL) 1 MG tablet Take 0.5-2 mg by mouth 2 (two) times daily. 0.5 mg AM and 2 mg HS    . cetirizine (ZYRTEC) 10 MG tablet Take 10 mg by mouth daily as needed for allergies.    Marland Kitchen tretinoin (RETIN-A) 0.025 % cream Apply 1 application topically at bedtime.       Musculoskeletal: Strength & Muscle Tone: within normal limits Gait & Station: normal Patient leans: N/A  Psychiatric Specialty Exam: Review of Systems  Constitutional: Negative.   HENT: Negative.   Eyes: Negative.   Respiratory: Negative.   Cardiovascular: Negative.   Gastrointestinal:  Negative.   Genitourinary: Negative.   Musculoskeletal: Negative.   Skin: Negative.   Neurological: Negative.   Endo/Heme/Allergies: Negative.   Psychiatric/Behavioral: Positive for hallucinations. The patient is nervous/anxious.     Blood pressure 126/69, pulse 88, temperature 97.5 F (36.4 C), temperature source Oral, resp. rate 20, SpO2 100 %.There is no weight on file to calculate BMI.  General Appearance: Casual  Eye Contact::  Fair  Speech:  Blocked  Volume:  Decreased  Mood:  Anxious  Affect:  Congruent  Thought Process:  Circumstantial  Orientation:  Full (Time, Place, and Person)  Thought Content:  Hallucinations: Auditory  Suicidal Thoughts:  No  Homicidal Thoughts:  No  Memory:  Immediate;   Fair Recent;   Fair Remote;   Fair  Judgement:  Fair  Insight:  Lacking  Psychomotor Activity:  Normal  Concentration:  Fair  Recall:  [employer] of Knowledge:Fair  Language: Fair  Akathisia:  No  Handed:  Right  AIMS (if indicated):     Assets:  Desire for Improvement Housing Physical Health Social Support  ADL's:  Intact  Cognition: Impaired,  Mild  Sleep:      Disposition: Patient's case discussed with Dr. Darleene Cleaver. At present, patient exhibits no evidence of imminent risk to himself or others. He does not meet criteria for psychiatric inpatient admission. Patient has an appointment with his outpatient provider on Monday. Both father and patient agree to keep this follow up appointment. He will be discharged home and with outpatient follow up.   Serena Colonel, FNP-BC [place] 03/05/2015 11:33 AM Patient seen face-to-face for psychiatric evaluation, chart reviewed and case discussed with the physician extender and  developed treatment plan. Reviewed the information documented and agree with the treatment plan. Corena Pilgrim, MD

## 2015-03-05 NOTE — ED Notes (Signed)
Patient states he is here because he called 911 when he was not suppose to. Patient oriented to unit. Patient denies SI, HI and AVH at this time. Encouragement and support provided and safety maintain. Q 15 min safety checks in place.

## 2015-03-05 NOTE — BHH Suicide Risk Assessment (Signed)
Suicide Risk Assessment  Discharge Assessment   [facility] Discharge Suicide Risk Assessment   Principal Problem: Auditory hallucinations Discharge Diagnoses:  Patient Active Problem List   Diagnosis Date Noted  . Auditory hallucinations [R44.0] 03/05/2015  . Anxiety and depression [F41.8] 08/22/2014  . History of Asperger's syndrome [Z86.59] 08/22/2014    Total Time spent with patient: 20 minutes  Musculoskeletal: Strength & Muscle Tone: within normal limits Gait & Station: normal Patient leans: N/A  Psychiatric Specialty Exam: Blood pressure 126/69, pulse 88, temperature 97.5 F (36.4 C), temperature source Oral, resp. rate 20, SpO2 100 %.There is no weight on file to calculate BMI.  General Appearance: Casual  Eye Contact::  Fair  Speech:  Blocked  Volume:  Decreased  Mood:  Anxious  Affect:  Congruent  Thought Process:  Circumstantial  Orientation:  Full (Time, Place, and Person)  Thought Content:  Hallucinations: Auditory  Suicidal Thoughts:  No  Homicidal Thoughts:  No  Memory:  Immediate;   Fair Recent;   Fair Remote;   Fair  Judgement:  Fair  Insight:  Lacking  Psychomotor Activity:  Normal  Concentration:  Fair  Recall:  [employer] of Knowledge:Fair  Language: Fair  Akathisia:  No  Handed:  Right  AIMS (if indicated):     Assets:  Desire for Improvement Housing Physical Health Social Support  ADL's:  Intact  Cognition: Impaired,  Mild  Sleep:      Mental Status Per Nursing Assessment::   On Admission:  Alert with auditory hallucinations  Demographic Factors:  Male, Adolescent or young adult, Caucasian and Unemployed  Loss Factors: NA  Historical Factors: NA  Risk Reduction Factors:   Living with another person, especially a relative, Positive social support and Positive therapeutic relationship  Continued Clinical Symptoms:  Asperger's  Cognitive Features That Contribute To Risk:  Asperger's   Suicide Risk:  Minimal: No identifiable  suicidal ideation.  Patients presenting with no risk factors but with morbid ruminations; may be classified as minimal risk based on the severity of the depressive symptoms    Plan Of Care/Follow-up recommendations:  Activity:  As tolerated  Diet:  Regular Tests:  As determined by PCP or outpatient provider Other:  Keep follow up appointment with Tamela Oddi  1.Take all your medications as prescribed.  2. Report any adverse side effects to your medication to your outpatient provider. 3. Do not use alcohol or illegal drugs while taking prescription medications. 4. In the event of worsening symptoms, call 911, the crisis hotline or go to nearest emergency room for evaluation of symptoms.  Alberteen Sam, FNP-BC [facility] 03/05/2015, 11:34 AM

## 2015-03-05 NOTE — BH Assessment (Addendum)
Tele Assessment Note   Jack Roy is an 20 y.o. male  presenting to [facility] after contacting 911. Pt stated "I am here because I called 911".Pt denies SI and HI at this time. Pt did not report any previous suicide attempts but shared that he has had thoughts in the past. Pt reported that he is currently experiencing auditory hallucinations. He stated "some help me and some harm me". Pt appears to be responding to internal stimuli at this time. Pt is currently under the care of a psychiatrist at this time and was hospitalized in the past. Pt did not report any alcohol or illicit substance abuse. Pt did not report any physical, sexual or emotional abuse at this time.  Inpatient treatment is recommended.   Diagnosis: Major Depressive Disorder, Recurrent episode, with psychotic features   Past Medical History:  Past Medical History  Diagnosis Date  . Seasonal allergies   . Asperger syndrome   . Anxiety   . Depression     Past Surgical History  Procedure Laterality Date  . Wisdom tooth extraction      Family History: History reviewed. No pertinent family history.  Social History:  reports that he has never smoked. He does not have any smokeless tobacco history on file. He reports that he does not drink alcohol or use illicit drugs.  Additional Social History:  Alcohol / Drug Use History of alcohol / drug use?: No history of alcohol / drug abuse  CIWA: CIWA-Ar BP: 116/78 mmHg Pulse Rate: 77 COWS:    PATIENT STRENGTHS: (choose at least two) Average or above average intelligence Supportive family/friends  Allergies: No Known Allergies  Home Medications:  (Not in a hospital admission)  OB/GYN Status:  No LMP for male patient.  General Assessment Data [facility] of Assessment: [facility] TTS Assessment: In system Is this a Tele or Face-to-Face Assessment?: Face-to-Face Is this an Initial Assessment or a Re-assessment for this encounter?: Initial Assessment Marital status:  Single Living Arrangements: Parent Can pt return to current living arrangement?: Yes Admission Status: Voluntary Is patient capable of signing voluntary admission?: Yes Referral Source: Self/Family/Friend Insurance type: Community education officer     Crisis Care Plan Living Arrangements: Parent Name of Psychiatrist: [facility] Name of Therapist: [facility]  Education Status Is patient currently in school?: No Current Grade: N/A Highest grade of school patient has completed: N/A Name of school: N/A Contact person: N/A  Risk to self with the past 6 months Suicidal Ideation: No Has patient been a risk to self within the past 6 months prior to admission? : No Suicidal Intent: No Has patient had any suicidal intent within the past 6 months prior to admission? : No Is patient at risk for suicide?: No Suicidal Plan?: No Has patient had any suicidal plan within the past 6 months prior to admission? : No Access to Means: No What has been your use of drugs/alcohol within the last 12 months?: Pt denies  Previous Attempts/Gestures: No How many times?: 0 Other Self Harm Risks: PT denies  Triggers for Past Attempts: None known Intentional Self Injurious Behavior: None Family Suicide History: No Recent stressful life event(s):  (PT denies ) Persecutory voices/beliefs?: No Depression: Yes Depression Symptoms: Tearfulness, Fatigue, Loss of interest in usual pleasures, Guilt, Feeling worthless/self pity Substance abuse history and/or treatment for substance abuse?: No Suicide prevention information given to non-admitted patients: Not applicable  Risk to Others within the past 6 months Homicidal Ideation: No Does patient have any lifetime risk of violence  toward others beyond the six months prior to admission? : No Thoughts of Harm to Others: No Current Homicidal Intent: No Current Homicidal Plan: No Access to Homicidal Means: No Identified Victim: N/A History of harm to others?:  No Assessment of Violence: None Noted Violent Behavior Description: No violent behaviors observed.  Does patient have access to weapons?: No Criminal Charges Pending?: No Does patient have a court date: No Is patient on probation?: No  Psychosis Hallucinations: Auditory, With command Delusions: None noted  Mental Status Report Appearance/Hygiene: In scrubs Eye Contact: Poor Motor Activity: Freedom of movement Speech: Soft Level of Consciousness: Quiet/awake Mood: Euthymic Affect: Blunted Anxiety Level: Minimal Thought Processes: Coherent, Relevant Judgement: Unimpaired Orientation: Appropriate for developmental age Obsessive Compulsive Thoughts/Behaviors: None  Cognitive Functioning Concentration: Decreased Memory: Recent Intact, Remote Intact IQ: Average Insight: Fair Impulse Control: Fair Appetite: Good Weight Loss: 0 Weight Gain: 0 Sleep: Decreased Total Hours of Sleep: 5 Vegetative Symptoms: None  ADLScreening [facility] Assessment Services) Patient's cognitive ability adequate to safely complete daily activities?: Yes Patient able to express need for assistance with ADLs?: Yes Independently performs ADLs?: Yes (appropriate for developmental age)  Prior Inpatient Therapy Prior Inpatient Therapy: No  Prior Outpatient Therapy Prior Outpatient Therapy: Yes Prior Therapy Dates: Current  Prior Therapy Facilty/Provider(s): [facility]  Reason for Treatment: Medication management  Does patient have an ACCT team?: No Does patient have Intensive In-House Services?  : No Does patient have [facility] services? : No Does patient have P4CC services?: No  ADL Screening (condition at time of admission) Patient's cognitive ability adequate to safely complete daily activities?: Yes Is the patient deaf or have difficulty hearing?: No Does the patient have difficulty seeing, even when wearing glasses/contacts?: No Does the patient have difficulty concentrating, remembering,  or making decisions?: No Patient able to express need for assistance with ADLs?: Yes Does the patient have difficulty dressing or bathing?: No Independently performs ADLs?: Yes (appropriate for developmental age)       Abuse/Neglect Assessment (Assessment to be complete while patient is alone) Physical Abuse: Denies Verbal Abuse: Denies Sexual Abuse: Denies Exploitation of patient/patient's resources: Denies Self-Neglect: Denies     Merchant navy officer (For Healthcare) Does patient have an advance directive?: No Would patient like information on creating an advanced directive?: No - patient declined information    Additional Information 1:1 In Past 12 Months?: No CIRT Risk: No Elopement Risk: No Does patient have medical clearance?: Yes     Disposition:  Disposition Initial Assessment Completed for this Encounter: Yes Disposition of Patient: Inpatient treatment program Type of inpatient treatment program: Adult  Sung Parodi S 03/05/2015 12:58 AM

## 2015-03-05 NOTE — BH Assessment (Signed)
Assessment completed. Consulted Ijeoma Nwaeze, NP who recommended inpatient treatment. TTS to seek placement. Informed Shari Upstill, PA-C of recommendation.  

## 2015-03-05 NOTE — ED Notes (Signed)
TTS consult in progress. °

## 2015-03-05 NOTE — ED Notes (Signed)
Mother contact # [phone] .

## 2015-03-05 NOTE — Discharge Instructions (Signed)
Please keep follow up appointment with Jack Roy on Monday as previously scheduled.

## 2015-12-31 IMAGING — US US SOFT TISSUE HEAD/NECK
1 series · 14 of 25 positions shown · non-contrast
Comparison: None.

CLINICAL DATA: History of a tender nodule above the right thyroid
lobe. The nodule is no longer palpable after taking antibiotics.

EXAM:
THYROID ULTRASOUND
TECHNIQUE: Ultrasound examination of the thyroid gland and adjacent soft
tissues was performed.

[Series 1: us soft tissue head/neck · 0.08mm/px · 14 of 35 slices shown]
[im 1/35]
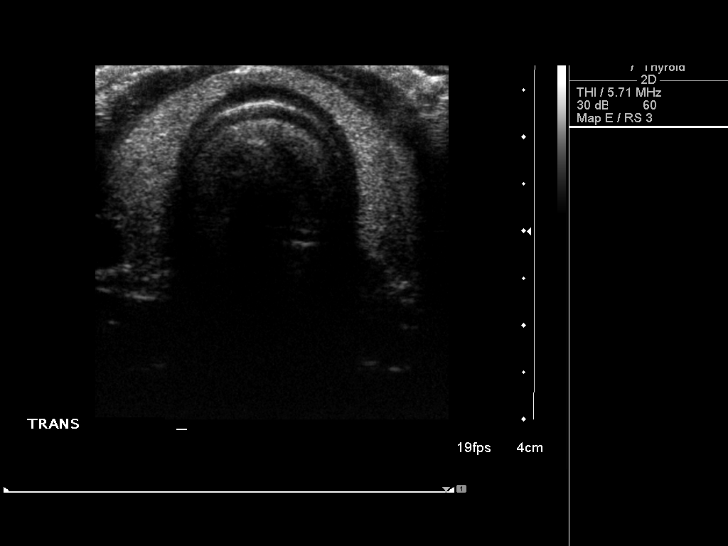
[im 3/35]
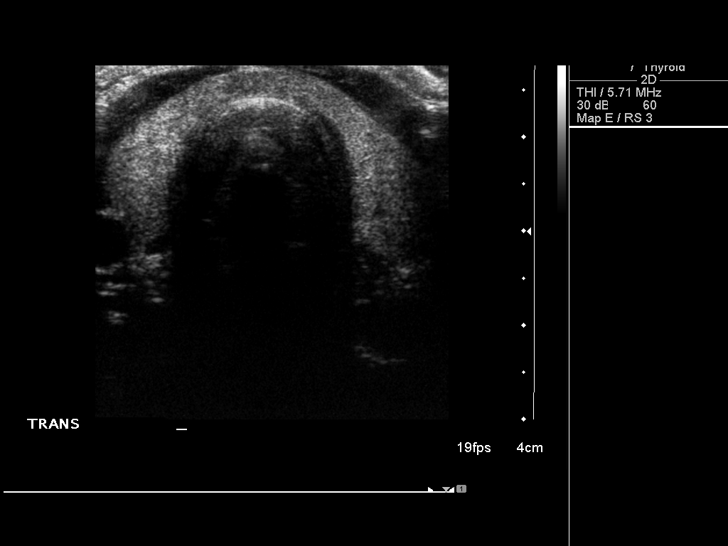
[im 6/35]
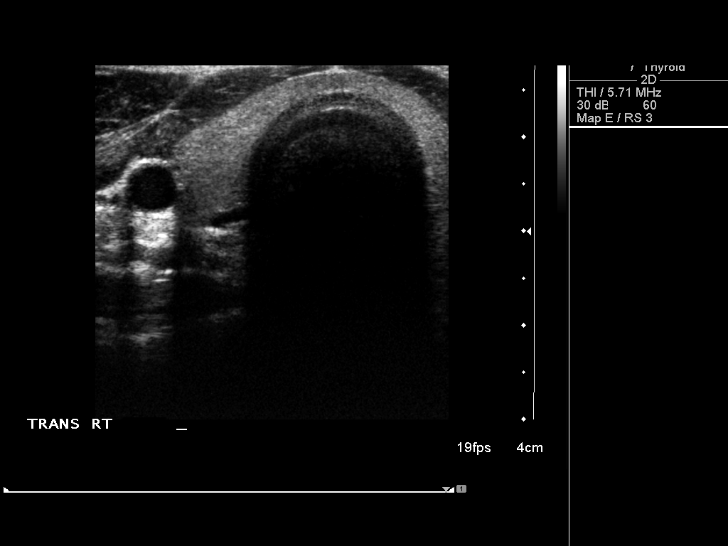
[im 9/35]
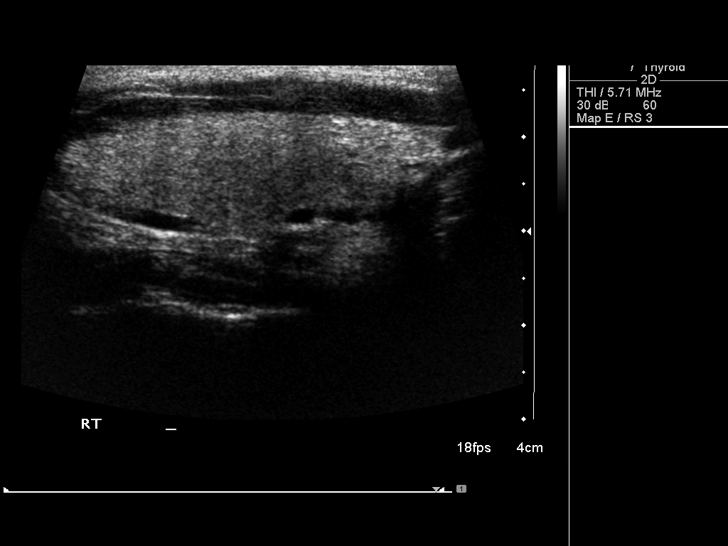
[im 12/35]
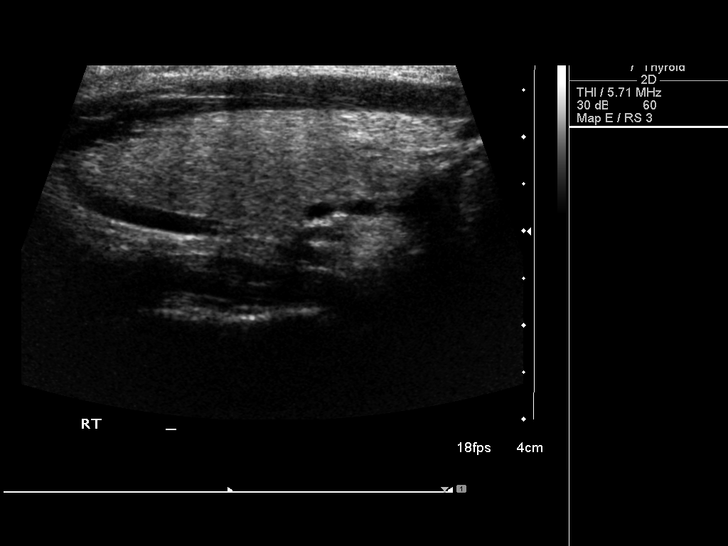
[im 13/35]
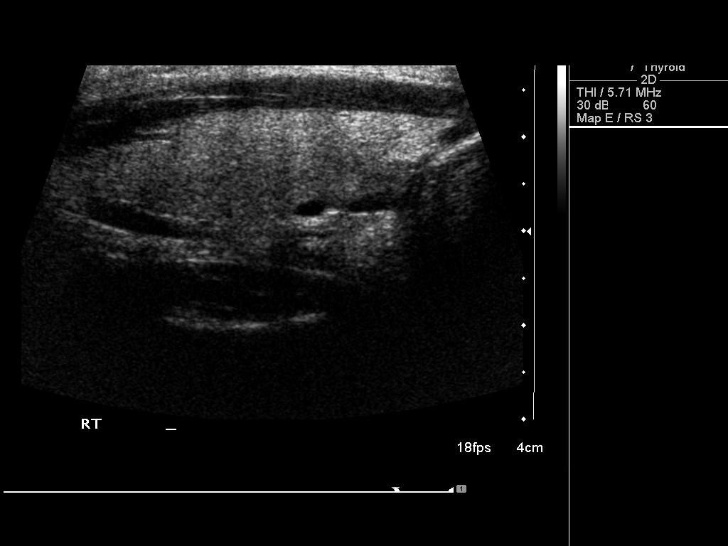
[im 16/35]
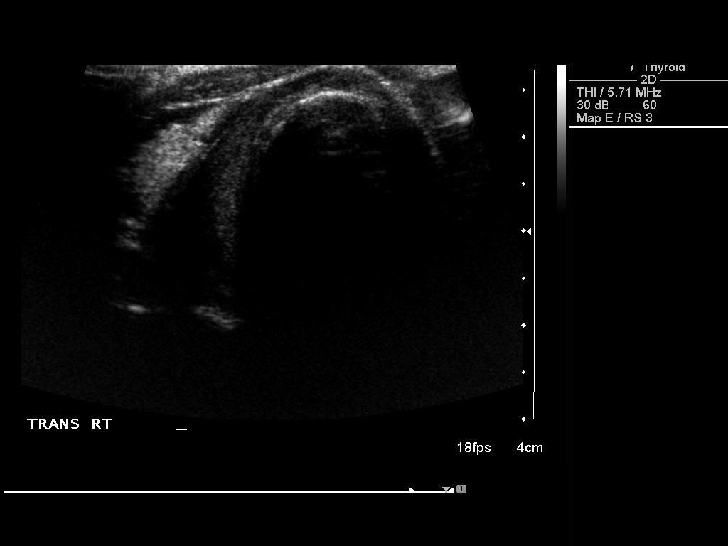
[im 19/35]
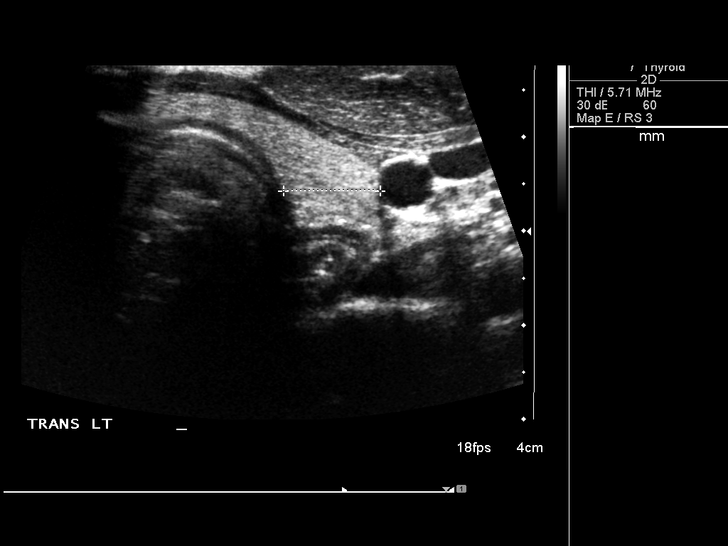
[im 22/35]
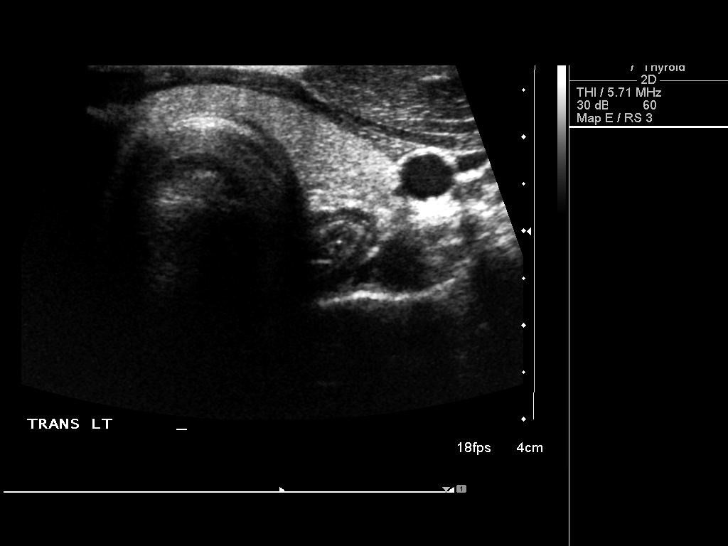
[im 23/35]
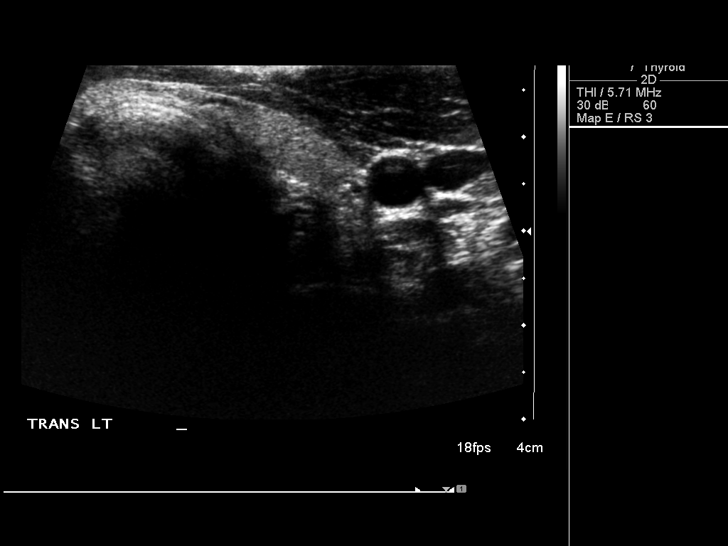
[im 26/35]
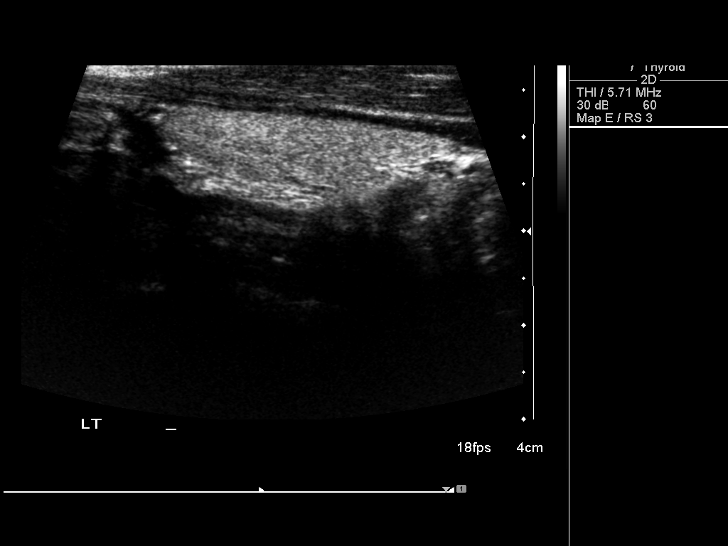
[im 29/35]
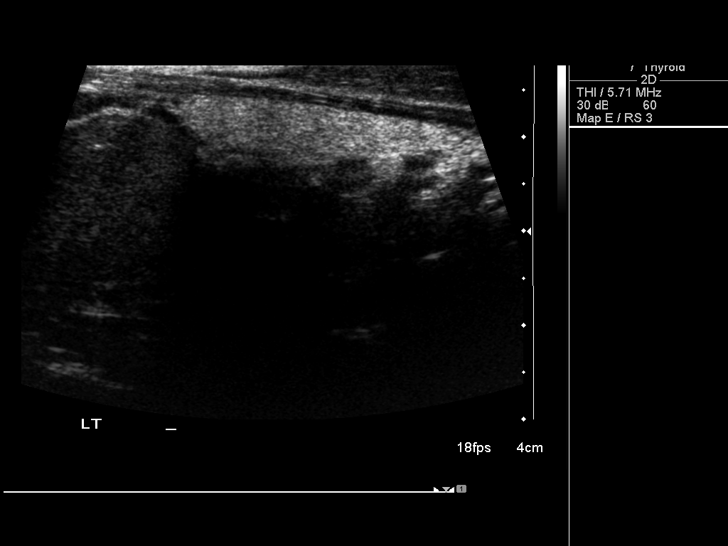
[im 32/35]
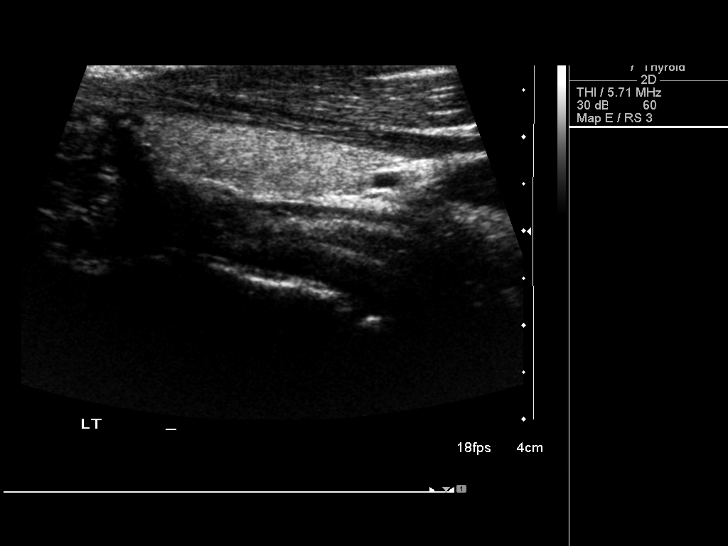
[im 35/35]
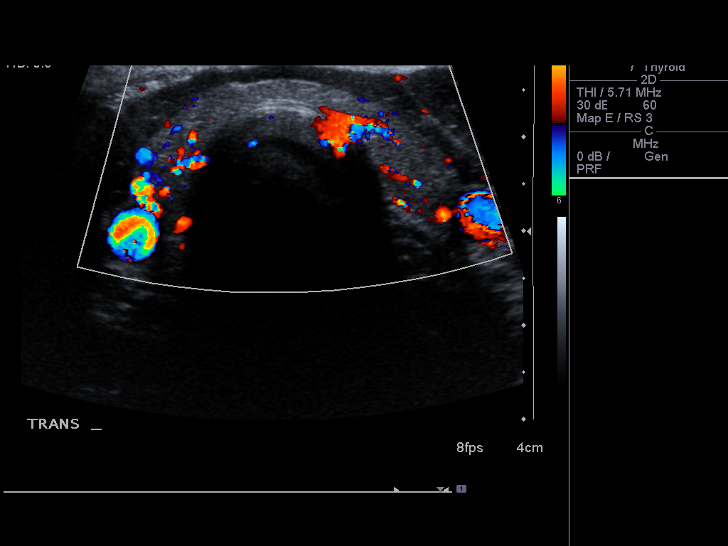

[14 of 25 positions shown; findings below may reference images not displayed]

FINDINGS: Right thyroid lobe

Measurements: 4.1 x 1.3 x 0.9 cm. Thyroid tissue is homogeneous
without a focal nodule. The soft tissue above the right thyroid lobe
appears normal without a focal nodule.

Left thyroid lobe

Measurements: 3.3 x 0.9 x 1.0 cm. Left thyroid tissue is homogeneous
without a nodule.

Isthmus

Thickness: 0.2 cm.  No nodules visualized.

Lymphadenopathy

None visualized.
IMPRESSION: Normal thyroid ultrasound.

## 2018-02-17 ENCOUNTER — Emergency Department (HOSPITAL_COMMUNITY)
Admission: EM | Admit: 2018-02-17 | Discharge: 2018-02-19 | Disposition: A | Discharge status: Other Acute Inpatient Hospital | Payer: Commercial Managed Care - PPO | Attending: Emergency Medicine | Admitting: Emergency Medicine

## 2018-02-17 ENCOUNTER — Encounter (HOSPITAL_COMMUNITY): Payer: Self-pay

## 2018-02-17 DIAGNOSIS — F209 Schizophrenia, unspecified: Secondary | ICD-10-CM | POA: Insufficient documentation

## 2018-02-17 DIAGNOSIS — F845 Asperger's syndrome: Secondary | ICD-10-CM | POA: Insufficient documentation

## 2018-02-17 DIAGNOSIS — R45851 Suicidal ideations: Secondary | ICD-10-CM | POA: Diagnosis not present

## 2018-02-17 DIAGNOSIS — Z79899 Other long term (current) drug therapy: Secondary | ICD-10-CM | POA: Diagnosis not present

## 2018-02-17 LAB — COMPREHENSIVE METABOLIC PANEL
ALT: 19 U/L (ref 0–44)
AST: 20 U/L (ref 15–41)
Albumin: 4.7 g/dL (ref 3.5–5.0)
Alkaline Phosphatase: 59 U/L (ref 38–126)
Anion gap: 10 (ref 5–15)
BILIRUBIN TOTAL: 1 mg/dL (ref 0.3–1.2)
BUN: 17 mg/dL (ref 6–20)
CHLORIDE: 108 mmol/L (ref 98–111)
CO2: 21 mmol/L — ABNORMAL LOW (ref 22–32)
CREATININE: 1.12 mg/dL (ref 0.61–1.24)
Calcium: 9.8 mg/dL (ref 8.9–10.3)
Glucose, Bld: 107 mg/dL — ABNORMAL HIGH (ref 70–99)
POTASSIUM: 3.6 mmol/L (ref 3.5–5.1)
Sodium: 139 mmol/L (ref 135–145)
TOTAL PROTEIN: 8 g/dL (ref 6.5–8.1)

## 2018-02-17 LAB — CBC
HCT: 44.3 % (ref 39.0–52.0)
HEMOGLOBIN: 15 g/dL (ref 13.0–17.0)
MCH: 30.5 pg (ref 26.0–34.0)
MCHC: 33.9 g/dL (ref 30.0–36.0)
MCV: 90 fL (ref 80.0–100.0)
Platelets: 249 10*3/uL (ref 150–400)
RBC: 4.92 MIL/uL (ref 4.22–5.81)
RDW: 12.8 % (ref 11.5–15.5)
WBC: 10.5 10*3/uL (ref 4.0–10.5)
nRBC: 0 % (ref 0.0–0.2)

## 2018-02-17 LAB — SALICYLATE LEVEL

## 2018-02-17 LAB — ACETAMINOPHEN LEVEL

## 2018-02-17 LAB — ETHANOL: Alcohol, Ethyl (B): <10 mg/dL (ref <10)

## 2018-02-17 MED ORDER — RISPERIDONE 0.5 MG PO TABS: 0.5000 mg | ORAL_TABLET | Freq: Two times a day (BID) | ORAL | Status: DC

## 2018-02-17 MED ORDER — RISPERIDONE 2 MG PO TABS
2.0000 mg | ORAL_TABLET | Freq: Every day | ORAL | Status: DC
  Administered 2018-02-19: 2 mg via ORAL
  Filled 2018-02-17 (×2): qty 1

## 2018-02-17 MED ORDER — RISPERIDONE 0.5 MG PO TABS
0.5000 mg | ORAL_TABLET | Freq: Every day | ORAL | Status: DC
  Administered 2018-02-18 – 2018-02-19 (×2): 0.5 mg via ORAL
  Filled 2018-02-17 (×3): qty 1

## 2018-02-17 MED ORDER — OXCARBAZEPINE 300 MG PO TABS
300.0000 mg | ORAL_TABLET | Freq: Every day | ORAL | Status: DC
  Administered 2018-02-19: 300 mg via ORAL
  Filled 2018-02-17 (×2): qty 1

## 2018-02-17 MED ORDER — LORATADINE 10 MG PO TABS
10.0000 mg | ORAL_TABLET | Freq: Every day | ORAL | Status: DC
  Filled 2018-02-17 (×2): qty 1

## 2018-02-17 NOTE — ED Notes (Signed)
TTS assessment with Lelon Mast in progress at present.

## 2018-02-17 NOTE — ED Notes (Signed)
Bed: [facility] Expected date:  Expected time:  Means of arrival:  Comments: 

## 2018-02-17 NOTE — ED Triage Notes (Signed)
Pt tried to jump in front of a car and threatening suicide, pt's father is taking out IVC papers

## 2018-02-17 NOTE — ED Notes (Signed)
Bed: [facility]31 Expected date:  Expected time:  Means of arrival:  Comments: 

## 2018-02-17 NOTE — ED Provider Notes (Signed)
[facility]   CSN: [id number] Arrival date & time: 02/17/18  2000     History   Chief Complaint Chief Complaint  Patient presents with  . Autistic  . Aggressive Behavior    HPI Jack Roy is a 23 y.o. male past medical history of Asperger's, anxiety brought in by dad for suicidal ideations.  Per police, they were called to the scene after patient tried to jump in front of a car.  Patient states that he did it "because he wanted to kill himself."  Patient states that he has been suicidal over last few days.  When asked why, patient states because "I just wanted peace from it all and to go to sleep forever."  Patient states that he will "constantly have to report the people because they make him suicidal and because they do the things that need reported."  When asked who he reports this to, he states "the people."  She denies any HI   The history is provided by the patient and the police. The history is limited by a developmental delay.  History is limited by patient's history of Asperger.  Past Medical History:  Diagnosis Date  . Anxiety   . Asperger syndrome   . Depression   . Seasonal allergies     Patient Active Problem List   Diagnosis Date Noted  . Auditory hallucinations 03/05/2015  . Anxiety and depression 08/22/2014  . History of Asperger's syndrome 08/22/2014    Past Surgical History:  Procedure Laterality Date  . WISDOM TOOTH EXTRACTION          Home Medications    Prior to Admission medications   Medication Sig Start Date End Date Taking? Authorizing Provider  bismuth subsalicylate (PEPTO BISMOL) 262 MG chewable tablet Chew 262 mg by mouth daily as needed for indigestion.   Yes [provider]  FLUoxetine (PROZAC) 20 MG tablet Take 20 mg by mouth daily.   Yes [provider]  ibuprofen (ADVIL,MOTRIN) 200 MG tablet Take 400 mg by mouth every 6 (six) hours as needed for mild pain.    Yes [provider]  QUEtiapine (SEROQUEL) 50 MG tablet Take 50 mg by mouth 2 (two) times daily.   Yes [provider]    Family History History reviewed. No pertinent family history.  Social History Social History   Tobacco Use  . Smoking status: Never Smoker  . Smokeless tobacco: Never Used  Substance Use Topics  . Alcohol use: No  . Drug use: No     Allergies   Patient has no known allergies.   Review of Systems Review of Systems  Unable to perform ROS: Psychiatric disorder     Physical Exam Updated Vital Signs BP 115/77 (BP Location: Left Arm)   Pulse (!) 127   Temp 98.1 F (36.7 C) (Oral)   Resp 20   SpO2 99%   Physical Exam Vitals signs and nursing note reviewed.  Constitutional:      Appearance: Normal appearance. He is well-developed.  HENT:     Head: Normocephalic and atraumatic.  Eyes:     General: Lids are normal.     Conjunctiva/sclera: Conjunctivae normal.     Pupils: Pupils are equal, round, and reactive to light.  Neck:     Musculoskeletal: Full passive range of motion without pain.  Cardiovascular:     Rate and Rhythm: Normal rate and regular rhythm.     Pulses: Normal pulses.  Heart sounds: Normal heart sounds. No murmur. No friction rub. No gallop.   Pulmonary:     Effort: Pulmonary effort is normal.     Breath sounds: Normal breath sounds.     Comments: Lungs clear to auscultation bilaterally.  Symmetric chest rise.  No wheezing, rales, rhonchi. Abdominal:     Palpations: Abdomen is soft. Abdomen is not rigid.     Tenderness: There is no abdominal tenderness. There is no guarding.     Comments: Abdomen is soft, non-distended, non-tender. No rigidity, No guarding. No peritoneal signs.  Musculoskeletal: Normal range of motion.  Skin:    General: Skin is warm and dry.     Capillary Refill: Capillary refill takes less than 2 seconds.  Neurological:     Mental Status: He is alert and oriented to person, place, and  time.  Psychiatric:        Speech: Speech normal.        Thought Content: Thought content includes suicidal ideation.      ED Treatments / Results  Labs (all labs ordered are listed, but only abnormal results are displayed) Labs Reviewed  COMPREHENSIVE METABOLIC PANEL - Abnormal; Notable for the following components:      Result Value   CO2 21 (*)    Glucose, Bld 107 (*)    All other components within normal limits  ACETAMINOPHEN LEVEL - Abnormal; Notable for the following components:   Acetaminophen (Tylenol), Serum <10 (*)    All other components within normal limits  ETHANOL  SALICYLATE LEVEL  CBC  RAPID URINE DRUG SCREEN, HOSP PERFORMED    EKG None  Radiology No results found.  Procedures Procedures (including critical care time)  Medications Ordered in ED Medications  loratadine (CLARITIN) tablet 10 mg (10 mg Oral Refused 02/18/18 0901)  Oxcarbazepine (TRILEPTAL) tablet 300 mg (300 mg Oral Refused 02/17/18 2328)  risperiDONE (RISPERDAL) tablet 2 mg (2 mg Oral Refused 02/17/18 2328)  risperiDONE (RISPERDAL) tablet 0.5 mg (0.5 mg Oral Refused 02/18/18 1252)     Initial Impression / Assessment and Plan / ED Course  I have reviewed the triage vital signs and the nursing notes.  Pertinent labs & imaging results that were available during my care of the patient were reviewed by me and considered in my medical decision making (see chart for details).     23 year old male brought in by dad for suicidal ideation.  Patient attempted to jump in front of a car.  Unfortunately dad was not at bedside during my initial evaluation.  Will plan for medical clearance labs, TTS consultation.  I discussed with mom who later came at bedside.  She reports patient has a history of Asperger and paranoid schizophrenia.  He had recently had a change in his Invega injection.  She reports normally he was getting it every month but he recently switched every 3 months.  Mom reports that his last  injection was in November 2018.  She reports that since then, patient has had increased paranoia and behavior issues.  She states that he has been talking about "reports with twitch" and is suspicious of certain people.  Additionally, she reports that patient has not been making sense when he talks.  Mom states that this is abnormal for him.  Acetaminophen level, ethanol level, salicylate level unremarkable.  CMP shows bicarb of 21.  Otherwise unremarkable.  CBC shows no evidence of leukocytosis or anemia. Patient is medically cleared.   Final Clinical Impressions(s) / ED Diagnoses  Final diagnoses:  Suicidal ideation    ED Discharge Orders    None       Rosana HoesLayden, Carisa Backhaus A, PA-C 02/18/18 1551    Little, Ambrose Finlandachel Morgan, MD 02/18/18 [phone]

## 2018-02-17 NOTE — ED Notes (Addendum)
Pt with Asbergers Syndrome, presents accompanied by [employer] for SI, plan to jump in front of car.  No HI or AVH noted.  Pt awake, alert & responsive, no distress noted, calm & cooperative at present.  Monitoring for safety, Q 15 min checks in effect.  [employer] at bedside at present.

## 2018-02-18 ENCOUNTER — Other Ambulatory Visit: Payer: Self-pay

## 2018-02-18 LAB — RAPID URINE DRUG SCREEN, HOSP PERFORMED
Amphetamines: NOT DETECTED
BARBITURATES: NOT DETECTED
Benzodiazepines: NOT DETECTED
COCAINE: NOT DETECTED
Opiates: NOT DETECTED
TETRAHYDROCANNABINOL: NOT DETECTED

## 2018-02-18 NOTE — ED Notes (Addendum)
Pt changed into scrubs with [employer] present. He accepted a [place] sandwich and a sprite. Pt is watching tv spongebob in bed.  [employer] watching patient as sitters until parent returns.

## 2018-02-18 NOTE — ED Notes (Signed)
Patient reminded that he is to remain in his room at all times. Patient refused to bathe at this time. States he will agree to bath closer to bedtime. Will readdress bath with patient later this evening.

## 2018-02-18 NOTE — BH Assessment (Addendum)
Tele Assessment Note   Patient Name: Jack Roy MRN: [id number] Referring Physician: Graciella Freer, PA-C Location of Patient: [name] Location of Provider: Behavioral Health TTS Department  Jarone Ahmed is a 23 y.o. male who was brought to the [facility] due to having SI and for running into traffic with the intention of getting hit and killed. Pt shares he did this because he felt suicidal since he "figured out how many people [he] sent the report to." When clinician inquired as to what report pt was referring to, pt shared he had a report of "bad things that I don't like but good things like Pandora music." Pt went on to discuss further specifics of these concerns, though they were difficult to understand, as pt was talking fast and was somewhat slurring his words. Pt shared he has attempted to kill himself approximately 5 times, though he shared he was unsure this number was correct and he was unsure when the last incident was prior to tonight. He shared he's been hospitalized numerous times, though the only hospital clinician could recognize from the ones pt noted was [facility]. Pt shared he's not experiencing HI, though he is upset with his father for forcing him to take medication. When clinician inquired as to whether he'd ever intentionally harm his father, pt stated that he wouldn't do so intentionally. Pt verified that he experiences AH as voices in his head that say terrible things; he denies VH and NSSIB.  Pt denies access to guns and any involvement in the legal system. Pt listed the medications he was taking, though clinician could not catch the names of them and pt was unable to spell them. Pt shares he sees [name] as his psychiatrist through [facility]. He stated his sleep varies from 5 hours to 14 hours per night and that his appetite is good.  Pt is oriented x3--he utilized his wristband for his name and DOB and, when asked if he would be able to  provide that information independently, he stated that sometimes he can but, at this time, he cannot. [name] memory was unable to be determined at this time. Pt was cooperative throughout the assessment process. At this time, his insight, judgement, and impulse control is poor.  Diagnosis: F20.9, Schizophrenia  Past Medical History:  Past Medical History:  Diagnosis Date  . Anxiety   . Asperger syndrome   . Depression   . Seasonal allergies     Past Surgical History:  Procedure Laterality Date  . WISDOM TOOTH EXTRACTION      Family History: History reviewed. No pertinent family history.  Social History:  reports that he has never smoked. He has never used smokeless tobacco. He reports that he does not drink alcohol or use drugs.  Additional Social History:  Alcohol / Drug Use Pain Medications: Please see MAR Prescriptions: Please see MAR Over the Counter: Please see MAR History of alcohol / drug use?: No history of alcohol / drug abuse Longest period of sobriety (when/how long): N/A  CIWA: CIWA-Ar BP: 131/80 Pulse Rate: (!) 102 COWS:    Allergies: No Known Allergies  Home Medications: (Not in a hospital admission)   OB/GYN Status:  No LMP for male patient.  General Assessment Data Assessment unable to be completed: Yes Reason for not completing assessment: Pt being seen by MD Location of Assessment: [facility] ED TTS Assessment: In system Is this a Tele or Face-to-Face Assessment?: Tele Assessment Is this an Initial Assessment or a  Re-assessment for this encounter?: Initial Assessment Patient Accompanied by:: N/A Language Other than English: No Living Arrangements: Other (Comment)(Lives w/ father majority of times, also sometimes w/ mother) What gender do you identify as?: Male Marital status: Single Maiden name: Wilmon PaliGuenther Pregnancy Status: No Living Arrangements: Parent Can pt return to current living arrangement?: Yes Admission Status: Voluntary Is patient capable of  signing voluntary admission?: Yes Referral Source: Self/Family/Friend Insurance type: None     Crisis Care Plan Living Arrangements: Parent Legal Guardian: (N/A) Name of Psychiatrist: Sharlet SalinaJoe [facility]ebes - [facility] Name of Therapist: Unknown  Education Status Is patient currently in school?: No Is the patient employed, unemployed or receiving disability?: Receiving disability income  Risk to self with the past 6 months Suicidal Ideation: Yes-Currently Present Has patient been a risk to self within the past 6 months prior to admission? : Yes Suicidal Intent: Yes-Currently Present Has patient had any suicidal intent within the past 6 months prior to admission? : Yes Is patient at risk for suicide?: Yes Suicidal Plan?: Yes-Currently Present Has patient had any suicidal plan within the past 6 months prior to admission? : Yes Specify Current Suicidal Plan: Pt plans to run out in front of traffic Access to Means: Yes Specify Access to Suicidal Means: Pt has alreadyran into traffic tonight What has been your use of drugs/alcohol within the last 12 months?: None known Previous Attempts/Gestures: Yes How many times?: 5 Other Self Harm Risks: None noted Triggers for Past Attempts: Unpredictable Intentional Self Injurious Behavior: None Family Suicide History: Unable to assess Recent stressful life event(s): Turmoil (Comment)(Pt appears to be experiencing delusions) Persecutory voices/beliefs?: Yes Depression: No Depression Symptoms: Guilt, Feeling worthless/self pity Substance abuse history and/or treatment for substance abuse?: No Suicide prevention information given to non-admitted patients: Not applicable  Risk to Others within the past 6 months Homicidal Ideation: No Does patient have any lifetime risk of violence toward others beyond the six months prior to admission? : Unknown Thoughts of Harm to Others: Yes-Currently Present(Pt is currently upset with his father) Comment -  Thoughts of Harm to Others: Pt is currently upset with his father Current Homicidal Intent: No Current Homicidal Plan: No Access to Homicidal Means: No Identified Victim: Pt is upset with his father--verified wouldn't intentionally harm him History of harm to others?: (UTA) Assessment of Violence: On admission Violent Behavior Description: Unknown Does patient have access to weapons?: No(Pt denies) Criminal Charges Pending?: No Does patient have a court date: No Is patient on probation?: No  Psychosis Hallucinations: Auditory Delusions: Persecutory  Mental Status Report Appearance/Hygiene: Bizarre Eye Contact: Fair Motor Activity: Unremarkable Speech: Slurred Level of Consciousness: Alert Mood: Ambivalent Affect: Appropriate to circumstance Anxiety Level: Moderate Thought Processes: Circumstantial Judgement: Impaired Orientation: Place, Situation, Other (Comment), Time(Pt read his name and DOB off of his wristband) Obsessive Compulsive Thoughts/Behaviors: Moderate  Cognitive Functioning Concentration: Decreased Memory: Unable to Assess Is patient IDD: No Insight: Poor Impulse Control: Poor Appetite: Good Have you had any weight changes? : No Change Sleep: No Change Total Hours of Sleep: ([name] sleep varies from 5hrs - 14hrs) Vegetative Symptoms: None  ADLScreening [facility] Assessment Services) Patient's cognitive ability adequate to safely complete daily activities?: No Patient able to express need for assistance with ADLs?: Yes Independently performs ADLs?: Yes (appropriate for developmental age)  Prior Inpatient Therapy Prior Inpatient Therapy: Yes Prior Therapy Dates: Unknown [facility], others (unable to get name from pt) Reason for Treatment: MH, SI  Prior Outpatient Therapy Prior Outpatient  Therapy: Yes Prior Therapy Dates: Ongoing Prior Therapy Facilty/Provider(s): [facility] Reason for Treatment: MH,  SI Does patient have an ACCT team?: Unknown Does patient have Intensive In-House Services?  : Unknown Does patient have [facility] services? : Unknown Does patient have P4CC services?: No  ADL Screening (condition at time of admission) Patient's cognitive ability adequate to safely complete daily activities?: No Is the patient deaf or have difficulty hearing?: No Does the patient have difficulty seeing, even when wearing glasses/contacts?: No Does the patient have difficulty concentrating, remembering, or making decisions?: Yes Patient able to express need for assistance with ADLs?: Yes Does the patient have difficulty dressing or bathing?: No Independently performs ADLs?: Yes (appropriate for developmental age) Does the patient have difficulty walking or climbing stairs?: No Weakness of Legs: None Weakness of Arms/Hands: None     Therapy Consults (therapy consults require a physician order) PT Evaluation Needed: No OT Evalulation Needed: No SLP Evaluation Needed: No Abuse/Neglect Assessment (Assessment to be complete while patient is alone) Abuse/Neglect Assessment Can Be Completed: Unable to assess, patient is non-responsive or altered mental status Values / Beliefs Cultural Requests During Hospitalization: None Spiritual Requests During Hospitalization: None Consults Spiritual Care Consult Needed: No Social Work Consult Needed: No Merchant navy officerAdvance Directives (For Healthcare) Does Patient Have a Medical Advance Directive?: (UTD)       Disposition: Nira ConnJason Berry NP reviewed pt's chart and information and determined pt meets criteria for inpatient hospitalization. [name] and faxed out to multiple hospitals for potential placement. This information was provided to pt's nurse, Joanie CoddingtonLatricia RN.  Disposition Initial Assessment Completed for this Encounter: Yes Patient referred to: Other (Comment)([facility] other  [facility])  This service was provided via telemedicine using a 2-way, interactive audio and video technology.  Names of all persons participating in this telemedicine service and their role in this encounter. Name: Gomez CleverlyBradley Mitrano Role: Patient  Name: Duard BradySamantha Jaekwon Mcclune Role: Clinician    Ralph DowdySamantha L Gaye Scorza 02/18/2018 12:53 AM

## 2018-02-18 NOTE — ED Notes (Signed)
Patient calm, cooperative. Refuses to take medication. Patient has not slept since yesterday morning.

## 2018-02-18 NOTE — Progress Notes (Signed)
[facility] called and stated pt is accepted:  CSW received a call from [place]  Pt has been accepted by: [facility] Number for report is: [phone] [name] unit/room/bed number will be: Main Campus, Room TBD Accepting physician: Dr. Maisie Fus Cormwall   Pt can arrive ASAP after 8am on 02/19/18  CSW will update RN.  4:41 PM RN updated.  Dorothe Pea. Ketih Goodie, LCSW, LCAS, CSI Clinical Social Worker Ph: [phone]

## 2018-02-18 NOTE — ED Notes (Addendum)
Patient was moved back to TCU, not IVC's. [employer] said father was going to Magistrate to have IVC's. Father has not returned yet.  Patient cooperative but aggitated. Speech is repetative, " who are you, who are you, who are you" " my name is B-rad, My name is B-rad, my name is B-rad"  > Pt sts he doesn't like the nurse, but patient will cooperate with [employer] to walk to TCU and change into scrubs.

## 2018-02-18 NOTE — BH Assessment (Signed)
[facility] Assessment Progress Note  Per Juanetta Beets, DO, this pt requires psychiatric hospitalization at this time.  This Clinical research associate has spoken to pt's mother, Infinite Naylor [phone]) and ascertained that pt does not have a guardian at this time.  Pt subsequently signed Consent to Release Information both to his mother and to his father, Nikalus Laske [phone]).  Pt is now under IVC initiated by EDP Meridee Score, MD.  The following facilities have been contacted to seek placement for this pt, with results as noted:  Beds available, information sent, decision pending:  [facility] [address], [place] [id number] Health Coordinator [phone]

## 2018-02-18 NOTE — ED Notes (Signed)
Pts mother here speaking to peer support staff.  [employer] at pts door. Pt cooperative watching tv.  Pt has displayed no aggression since being here, just rocking, and repetitive, incoherant speech

## 2018-02-18 NOTE — ED Notes (Signed)
Per [employer] father never went to magistrate to get IVC paperwork, mother is on her way to get patient. Parents do not live together.  This Clinical research associatewriter asked [employer] who the legal guardian is and [employer] officer said " Armed forces logistics/support/administrative officer'm law enforcement, and I say the biological mother has every right to take the child home".

## 2018-02-18 NOTE — ED Provider Notes (Signed)
I was informed by Elijah Birk from behavioral health that the patient needs an IVC placed.  There was a first exam done yesterday by Dr. Clarene Duke but it was the understanding that the family was going to go to the magistrate to take an IVC out.  They ultimately did not do this and the patient is going to need an IVC for placement.  He was having suicidal thoughts and tried to run out in front of a car to hurt himself.  He is also having increasing paranoid thoughts.   Terrilee Files, MD 02/18/18 [phone]

## 2018-02-18 NOTE — ED Notes (Signed)
Pt attempted to elope from TCU, restrained by Security, brought back to department by Security.

## 2018-02-19 NOTE — ED Notes (Signed)
Patient in bed resting calmly. Ask for morning snack for breakfast. Was able to medicate patient through snack. Otherwise would not take any medications orally.

## 2018-02-19 NOTE — ED Provider Notes (Signed)
[facility] with Dr. Loletta Parishhomas Corment.  No issues upon my reevaluation.  Safe for discharge and transfer to psychiatric facility.   Virgina NorfolkCuratolo, Beauden Tremont, DO 02/19/18 1054

## 2018-02-19 NOTE — ED Notes (Signed)
Report called to RN at [facility]

## 2018-02-19 NOTE — ED Notes (Signed)
Sheriff called for transport  

## 2018-02-19 NOTE — ED Notes (Signed)
Pt has finally drifted back off to sleep.

## 2019-02-17 ENCOUNTER — Ambulatory Visit: Payer: Commercial Managed Care - PPO | Attending: Internal Medicine

## 2019-02-17 DIAGNOSIS — Z20822 Contact with and (suspected) exposure to covid-19: Secondary | ICD-10-CM

## 2019-02-18 LAB — NOVEL CORONAVIRUS, NAA: SARS-CoV-2, NAA: NOT DETECTED

## 2020-09-09 ENCOUNTER — Emergency Department (HOSPITAL_COMMUNITY)
Admission: EM | Admit: 2020-09-09 | Discharge: 2020-09-11 | Disposition: A | Discharge status: Other Acute Inpatient Hospital | Payer: Commercial Managed Care - PPO | Attending: Emergency Medicine | Admitting: Emergency Medicine

## 2020-09-09 ENCOUNTER — Other Ambulatory Visit: Payer: Self-pay

## 2020-09-09 DIAGNOSIS — F329 Major depressive disorder, single episode, unspecified: Secondary | ICD-10-CM | POA: Diagnosis present

## 2020-09-09 DIAGNOSIS — Z20822 Contact with and (suspected) exposure to covid-19: Secondary | ICD-10-CM | POA: Diagnosis not present

## 2020-09-09 DIAGNOSIS — F79 Unspecified intellectual disabilities: Secondary | ICD-10-CM

## 2020-09-09 DIAGNOSIS — R440 Auditory hallucinations: Secondary | ICD-10-CM | POA: Diagnosis present

## 2020-09-09 DIAGNOSIS — R45851 Suicidal ideations: Secondary | ICD-10-CM | POA: Insufficient documentation

## 2020-09-09 DIAGNOSIS — F6381 Intermittent explosive disorder: Secondary | ICD-10-CM | POA: Diagnosis present

## 2020-09-09 LAB — COMPREHENSIVE METABOLIC PANEL
ALT: 22 U/L (ref 0–44)
AST: 20 U/L (ref 15–41)
Albumin: 4.5 g/dL (ref 3.5–5.0)
Alkaline Phosphatase: 81 U/L (ref 38–126)
Anion gap: 9 (ref 5–15)
BUN: 20 mg/dL (ref 6–20)
CO2: 24 mmol/L (ref 22–32)
Calcium: 9.8 mg/dL (ref 8.9–10.3)
Chloride: 106 mmol/L (ref 98–111)
Creatinine, Ser: 0.97 mg/dL (ref 0.61–1.24)
GFR, Estimated: >60 mL/min (ref >60)
Glucose, Bld: 77 mg/dL (ref 70–99)
Potassium: 3.9 mmol/L (ref 3.5–5.1)
Sodium: 139 mmol/L (ref 135–145)
Total Bilirubin: 0.6 mg/dL (ref 0.3–1.2)
Total Protein: 7.8 g/dL (ref 6.5–8.1)

## 2020-09-09 LAB — RAPID URINE DRUG SCREEN, HOSP PERFORMED
Amphetamines: NOT DETECTED
Barbiturates: NOT DETECTED
Benzodiazepines: NOT DETECTED
Cocaine: NOT DETECTED
Opiates: NOT DETECTED
Tetrahydrocannabinol: NOT DETECTED

## 2020-09-09 LAB — CBC WITH DIFFERENTIAL/PLATELET
Abs Immature Granulocytes: 0.04 10*3/uL (ref 0.00–0.07)
Basophils Absolute: 0 10*3/uL (ref 0.0–0.1)
Basophils Relative: 0 %
Eosinophils Absolute: 0.2 10*3/uL (ref 0.0–0.5)
Eosinophils Relative: 2 %
HCT: 45.3 % (ref 39.0–52.0)
Hemoglobin: 15.7 g/dL (ref 13.0–17.0)
Immature Granulocytes: 0 %
Lymphocytes Relative: 15 %
Lymphs Abs: 1.3 10*3/uL (ref 0.7–4.0)
MCH: 29.9 pg (ref 26.0–34.0)
MCHC: 34.7 g/dL (ref 30.0–36.0)
MCV: 86.3 fL (ref 80.0–100.0)
Monocytes Absolute: 0.6 10*3/uL (ref 0.1–1.0)
Monocytes Relative: 6 %
Neutro Abs: 6.7 10*3/uL (ref 1.7–7.7)
Neutrophils Relative %: 77 %
Platelets: 202 10*3/uL (ref 150–400)
RBC: 5.25 MIL/uL (ref 4.22–5.81)
RDW: 12.2 % (ref 11.5–15.5)
WBC: 8.9 10*3/uL (ref 4.0–10.5)
nRBC: 0 % (ref 0.0–0.2)

## 2020-09-09 LAB — RESP PANEL BY RT-PCR (FLU A&B, COVID) ARPGX2
Influenza A by PCR: NEGATIVE
Influenza B by PCR: NEGATIVE
SARS Coronavirus 2 by RT PCR: NEGATIVE

## 2020-09-09 LAB — ETHANOL: Alcohol, Ethyl (B): <10 mg/dL (ref <10)

## 2020-09-09 MED ORDER — QUETIAPINE FUMARATE 50 MG PO TABS
50.0000 mg | ORAL_TABLET | Freq: Every day | ORAL | Status: DC
  Administered 2020-09-09: 50 mg via ORAL
  Filled 2020-09-09 (×2): qty 1

## 2020-09-09 MED ORDER — ACETAMINOPHEN 325 MG PO TABS
650.0000 mg | ORAL_TABLET | ORAL | Status: DC | PRN
  Administered 2020-09-10: 650 mg via ORAL
  Filled 2020-09-09: qty 2

## 2020-09-09 MED ORDER — FLUOXETINE HCL 20 MG PO CAPS
20.0000 mg | ORAL_CAPSULE | Freq: Every day | ORAL | Status: DC
  Administered 2020-09-10: 20 mg via ORAL
  Filled 2020-09-09: qty 1

## 2020-09-09 NOTE — ED Triage Notes (Signed)
Pt to [facility]  ED by [employer].  Pt calling 911 .  Pt SI.
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
## 2020-09-09 NOTE — ED Provider Notes (Signed)
[facility]ENC[facility] DEPT Provider Note   CSN: [id number] Arrival date & time: 09/09/20  1554     History Chief Complaint  Patient presents with   Suicidal    Jack Roy is a 25 y.o. male.  HPI Patient reports he has been feeling suicidal.  He has history of depression and Asperger syndrome.  He reports his mother and his father live separately but very close together.  He walks between the 2 homes but mostly stays at his father's house.  He reports he may have missed a dose of his medications today but usually is very regular about taking them on time.  He reports he does think that it makes things worse if he misses his medications.  He reports he has been feeling very worthless lately and it made him feel suicidal.  He tried to take a serrated knife and cut the back of his neck.  At this time he does not describe other plan for suicide.    Past Medical History:  Diagnosis Date   Anxiety    Asperger syndrome    Depression    Seasonal allergies     Patient Active Problem List   Diagnosis Date Noted   Auditory hallucinations 03/05/2015   Anxiety and depression 08/22/2014   History of Asperger's syndrome 08/22/2014    Past Surgical History:  Procedure Laterality Date   WISDOM TOOTH EXTRACTION         No family history on file.  Social History   Tobacco Use   Smoking status: Never   Smokeless tobacco: Never  Substance Use Topics   Alcohol use: No   Drug use: No    Home Medications Prior to Admission medications   Medication Sig Start Date End Date Taking? Authorizing Provider  bismuth subsalicylate (PEPTO BISMOL) 262 MG chewable tablet Chew 262 mg by mouth daily as needed for indigestion.    [provider]  FLUoxetine (PROZAC) 20 MG tablet Take 20 mg by mouth daily.    [provider]  ibuprofen (ADVIL,MOTRIN) 200 MG tablet Take 400 mg by mouth every 6 (six) hours as needed for mild pain.    [provider]  QUEtiapine (SEROQUEL) 50 MG tablet Take 50 mg by mouth 2 (two) times daily.    [provider]    Allergies    Patient has no known allergies.  Review of Systems   Review of Systems  Physical Exam Updated Vital Signs BP 114/82   Pulse (!) 107   Temp 98.2 F (36.8 C) (Oral)   SpO2 99%   Physical Exam  ED Results / Procedures / Treatments   Labs (all labs ordered are listed, but only abnormal results are displayed) Labs Reviewed  RESP PANEL BY RT-PCR (FLU A&B, COVID) ARPGX2  COMPREHENSIVE METABOLIC PANEL  ETHANOL  CBC WITH DIFFERENTIAL/PLATELET  RAPID URINE DRUG SCREEN, HOSP PERFORMED    EKG None  Radiology No results found.  Procedures Procedures   Medications Ordered in ED Medications  acetaminophen (TYLENOL) tablet 650 mg (has no administration in time range)  FLUoxetine (PROZAC) capsule 20 mg (has no administration in time range)  QUEtiapine (SEROQUEL) tablet 50 mg (has no administration in time range)    ED Course  I have reviewed the triage vital signs and the nursing notes.  Pertinent labs & imaging results that were available during my care of the patient were reviewed by me and considered in my medical decision making (see chart for details).  MDM Rules/Calculators/A&P                           Reports feelings of worthlessness and suicidal thoughts.  At this time, patient is medically cleared.  Final disposition per psychiatric consultation. Final Clinical Impression(s) / ED Diagnoses Final diagnoses:  Suicidal ideation    Rx / DC Orders ED Discharge Orders     None        Arby Barrette, MD 09/09/20 2100

## 2020-09-09 NOTE — ED Notes (Signed)
TTS in progress 

## 2020-09-10 DIAGNOSIS — F6381 Intermittent explosive disorder: Secondary | ICD-10-CM | POA: Diagnosis present

## 2020-09-10 DIAGNOSIS — R45851 Suicidal ideations: Secondary | ICD-10-CM

## 2020-09-10 DIAGNOSIS — F79 Unspecified intellectual disabilities: Secondary | ICD-10-CM

## 2020-09-10 MED ORDER — LORAZEPAM 1 MG PO TABS
1.0000 mg | ORAL_TABLET | Freq: Four times a day (QID) | ORAL | Status: DC | PRN
  Administered 2020-09-10: 1 mg via ORAL
  Filled 2020-09-10 (×2): qty 1

## 2020-09-10 MED ORDER — MELATONIN 3 MG PO TABS
3.0000 mg | ORAL_TABLET | Freq: Every evening | ORAL | Status: DC | PRN
  Administered 2020-09-10: 3 mg via ORAL
  Filled 2020-09-10: qty 1

## 2020-09-10 MED ORDER — OXCARBAZEPINE 300 MG PO TABS
600.0000 mg | ORAL_TABLET | Freq: Two times a day (BID) | ORAL | Status: DC
  Administered 2020-09-10: 600 mg via ORAL
  Filled 2020-09-10: qty 2

## 2020-09-10 MED ORDER — PALIPERIDONE PALMITATE ER 156 MG/ML IM SUSY
1.0000 mL | PREFILLED_SYRINGE | INTRAMUSCULAR | Status: DC
  Administered 2020-09-10: 156 mg via INTRAMUSCULAR
  Filled 2020-09-10: qty 1

## 2020-09-10 NOTE — Progress Notes (Signed)
[facility] contacted CSW in reference to this patient inquiring if the patient still needs placement. It was reported that the patient will be reviewed by the provider for possible placement.  Crissie Reese, MSW, LCSW-A, LCAS-A Phone: [phone] Disposition/TOC
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
## 2020-09-10 NOTE — ED Provider Notes (Addendum)
Emergency Medicine Observation Re-evaluation Note  Yacoub Diltz is a 25 y.o. male, seen on rounds today.  Pt initially presented to the ED for complaints of Suicidal Currently, the patient is resting comfortably.   Physical Exam  BP 104/62 (BP Location: Left Arm)   Pulse 72   Temp 98.1 F (36.7 C) (Oral)   Resp 16   SpO2 100%  Physical Exam General: Nontoxic appearance Cardiac: Normal heart rate Lungs: Normal respiratory Psych: No internal responsiveness Neck: No sign of significant injury to the anterior or posterior neck.  ED Course / MDM  EKG:   I have reviewed the labs performed to date as well as medications administered while in observation.  Recent changes in the last 24 hours include cooperative with plans for psychiatric admission.  Plan  Current plan is for psychiatric admission.  Kewan Mcnease is not under involuntary commitment.   12:50 PM-patient has a placement, outside the health system and will need to be transferred.  I have evaluated the patient and am performing IVC petition and first examination paperwork    Mancel Bale, MD 09/10/20 1309

## 2020-09-10 NOTE — ED Notes (Signed)
Called [phone] and left a messaged for pt transportation to [facility]. Left message. Called previously at 15:20 but have not heard back.

## 2020-09-10 NOTE — ED Notes (Addendum)
Pt has been calm, cooperative, and redirectable since 11:30am. Within the past hour, the speed of his pacing in his room increased. He came out his room stating that he was scared, his eyes were heavy, and his stomach hurt. He appears anxious. We called his father and pt spoke with him. Then pt took a shower. Pt is cooperative and redirectable. The officer who brought the pt to [facility] yesterday is working a an Surveyor, minerals in [employer] today. The officer said the pt had a bottle of lorazepam prescribed by Glenna Fellows, PA at [facility]. Messaged Provider for medication.

## 2020-09-10 NOTE — ED Notes (Signed)
Patient calm and cooperative this shift.  

## 2020-09-10 NOTE — Progress Notes (Addendum)
Per Laverda Page, pt has been accepted to [facility] unit. Accepting provider is Dr. Hildred Priest. Per Laverda Page the patient can arrive anytime pending IVC and negative COVID test. Number for report is [phone]. Please fax IVC paperwork before report is called, fax number is [phone].    Crissie Reese, MSW, LCSW-A Phone: [phone] Disposition/TOC

## 2020-09-10 NOTE — ED Notes (Addendum)
IVC paperwork received from magistrate office and completed at 15:12. Attempted to call report to [facility]. Ainsley, admission intake clinician, said they will not take report until the patient's transport is at [facility]. Called [phone] for transport. Faxed IVC paperwork to [facility].  Pt is calm, cooperative, and redirectable.

## 2020-09-10 NOTE — Progress Notes (Signed)
Per Leroy Sea, patient meets criteria for inpatient treatment. There are no available or appropriate beds at [facility] today. CSW faxed referrals to the following facilities for review:  [facility]  Pending - Request Sent N/A [address] [place] [address] [phone] --  [facility]  Pending - Request Sent N/A [address] [place], [place] [address] [phone] --  [facility]  Pending - Request Sent N/A [address] [place] [address] [phone] --  [facility]  Pending - Request Sent N/A [phone] [place] [address] [phone] --  [facility]  Pending - Request Sent N/A [address] [place], [place] [address] [phone] --  [facility]  Pending - Request Sent N/A [address], [place] [address] [phone] --  [facility]  Pending - Request Sent N/A [address] [place] [address] [phone] --  [facility]  Pending - Request Sent N/A [address] [place] [address] [phone] --  [facility]  Pending - Request Sent N/A [address], [place] [address] [phone] --  [facility]  Pending - Request Sent N/A [address] [place] [address] [phone] --  [facility]  Pending - Request Sent N/A [address] [place] [address] [phone] --  [facility]  Pending - Request Sent N/A [address], [place] [address] [phone] --  [facility]  Pending - Request Sent N/A [address], [place] [address] [phone] --  [facility]  Pending - Request Sent N/A [address] [place] [address]  [phone] --  [facility]  Pending - Request Sent N/A [address] [place], [place] [address] [phone] --  [facility]  Pending - Request Sent N/A [address], [place] [address] [phone] --  [facility]  Pending - Request Sent N/A [address], [place] [address] [phone] --  [facility]  Pending - No Request Sent N/A [address] [place] [place] [address] [phone] --  [facility]  Pending - No Request Sent N/A [address] [place] [address] [phone] --  [facility]  Pending - No Request Sent N/A [address] [place] [address] [phone] --  [facility]  Pending - No Request Sent N/A [address] [place] [address] [phone] --  [facility]  Pending - No Request Sent N/A [address] [place] [address] [phone] --  [facility]  Pending - No Request Sent N/A [address], [place] [address] [phone] --  [facility]  Pending - No Request Sent N/A [address] [place] [address] [phone] --  [facility]  Pending - No Request Sent N/A [address] [place] [address] [phone] --  [facility]  Pending - No Request Sent N/A [address], [place] [address] [phone] --  [facility]  Pending - No Request Sent N/A [address] [place] [address]      TTS will continue to seek bed placement.  Crissie Reese, MSW, LCSW-A, LCAS-A  Phone: [phone] Disposition/TOC

## 2020-09-10 NOTE — BH Assessment (Addendum)
Disposition:   Per chart review: Jack Rud, NP, reports that patient meets inpatient treatment. Selena Batten, AC, no available beds. Disposition SW to secure placement. Patient request [facility] due to prior treatment needs. Dr. Donnald Garre and Waynetta Sandy, RN, informed of disposition.  Disposition SW referred patient to the following facilities for consideration of bed placement:   [facility]  Pending - Request Sent N/A [address], [place] [address] [phone] --  [facility]  Pending - Request Sent N/A [address] [place], [place] [address] [phone] --  [facility]  Pending - Request Sent N/A [facility] [address] [place] [address] [phone] --  [facility]  Pending - Request Sent N/A [phone] [place] [address] [phone] --  [facility]  Pending - Request Sent N/A [address] [place], [place] [address] [phone] --  [facility]  Pending - Request Sent N/A [address], [place] [address] [phone] --  [facility]  Pending - Request Sent N/A [address], [place] [address] [phone] --  [facility]  Pending - Request Sent N/A [address] [place] [address] [phone] --  [facility]  Pending - Request Sent N/A [address], [place] [address] [phone] --  [facility]  Pending - Request Sent N/A [address] [place] [address] [phone] --  [facility]  Pending - Request Sent N/A [address] [place] [address] [phone] --  [facility]  Pending - Request Sent N/A [address], [place] [address] [phone] --  [facility]  Pending -  Request Sent N/A [address], [place] [address] [phone] --  [facility]  Pending - Request Sent N/A [address] [place] [address] [phone] --  [facility]  Pending - Request Sent N/A [address] [place] [address] [phone] --  [facility]  Pending - Request Sent N/A [address] [place] [address] [phone] --  [facility]  Pending - Request Sent N/A [address] [place] [address] [phone] --

## 2020-09-10 NOTE — ED Notes (Signed)
Pt father at bedside.

## 2020-09-11 NOTE — ED Provider Notes (Signed)
Emergency Medicine Observation Re-evaluation Note  Jack Roy is a 25 y.o. male, seen on rounds today.  Pt initially presented to the ED for complaints of Suicidal Currently, the patient is resting comfortably.  Physical Exam  BP 113/75 (BP Location: Right Arm)   Pulse 97   Temp 97.7 F (36.5 C) (Oral)   Resp 20   SpO2 99%  Physical Exam General: Nontoxic Cardiac: Normal heart rate Lungs: Normal respiratory rate Psych: Not responding to internal stimuli currently  ED Course / MDM  EKG:   I have reviewed the labs performed to date as well as medications administered while in observation.  Recent changes in the last 24 hours include stable, calm.  Plan  Current plan is for transfer, now.  Jack Roy is under involuntary commitment.     Mancel Bale, MD 09/11/20 [phone]

## 2020-09-11 NOTE — ED Notes (Signed)
Sheriff - transportation in route

## 2020-09-11 NOTE — ED Notes (Signed)
Report given to Inetta Fermo - RN - Alvia Grove

## 2020-09-11 NOTE — ED Notes (Signed)
Mother informed of transport

## 2020-10-21 ENCOUNTER — Other Ambulatory Visit: Payer: Self-pay

## 2020-10-21 ENCOUNTER — Emergency Department (HOSPITAL_COMMUNITY)
Admission: EM | Admit: 2020-10-21 | Discharge: 2020-10-23 | Disposition: A | Discharge status: Home / Self Care | Payer: Commercial Managed Care - PPO | Attending: Emergency Medicine | Admitting: Emergency Medicine

## 2020-10-21 DIAGNOSIS — F6381 Intermittent explosive disorder: Secondary | ICD-10-CM | POA: Diagnosis not present

## 2020-10-21 DIAGNOSIS — Z046 Encounter for general psychiatric examination, requested by authority: Secondary | ICD-10-CM | POA: Diagnosis present

## 2020-10-21 DIAGNOSIS — R45851 Suicidal ideations: Secondary | ICD-10-CM | POA: Diagnosis not present

## 2020-10-21 DIAGNOSIS — F418 Other specified anxiety disorders: Secondary | ICD-10-CM | POA: Insufficient documentation

## 2020-10-21 DIAGNOSIS — R4689 Other symptoms and signs involving appearance and behavior: Secondary | ICD-10-CM

## 2020-10-21 DIAGNOSIS — Z9114 Patient's other noncompliance with medication regimen: Secondary | ICD-10-CM | POA: Insufficient documentation

## 2020-10-21 DIAGNOSIS — R4189 Other symptoms and signs involving cognitive functions and awareness: Secondary | ICD-10-CM | POA: Diagnosis not present

## 2020-10-21 DIAGNOSIS — Z20822 Contact with and (suspected) exposure to covid-19: Secondary | ICD-10-CM | POA: Diagnosis not present

## 2020-10-21 DIAGNOSIS — R259 Unspecified abnormal involuntary movements: Secondary | ICD-10-CM | POA: Diagnosis not present

## 2020-10-21 DIAGNOSIS — F79 Unspecified intellectual disabilities: Secondary | ICD-10-CM

## 2020-10-21 DIAGNOSIS — Z8659 Personal history of other mental and behavioral disorders: Secondary | ICD-10-CM

## 2020-10-21 DIAGNOSIS — R440 Auditory hallucinations: Secondary | ICD-10-CM | POA: Insufficient documentation

## 2020-10-21 LAB — RESP PANEL BY RT-PCR (FLU A&B, COVID) ARPGX2
Influenza A by PCR: NEGATIVE
Influenza B by PCR: NEGATIVE
SARS Coronavirus 2 by RT PCR: NEGATIVE

## 2020-10-21 LAB — CBC
HCT: 41.6 % (ref 39.0–52.0)
Hemoglobin: 14.5 g/dL (ref 13.0–17.0)
MCH: 29.7 pg (ref 26.0–34.0)
MCHC: 34.9 g/dL (ref 30.0–36.0)
MCV: 85.2 fL (ref 80.0–100.0)
Platelets: 219 10*3/uL (ref 150–400)
RBC: 4.88 MIL/uL (ref 4.22–5.81)
RDW: 12.1 % (ref 11.5–15.5)
WBC: 8.5 10*3/uL (ref 4.0–10.5)
nRBC: 0 % (ref 0.0–0.2)

## 2020-10-21 LAB — COMPREHENSIVE METABOLIC PANEL
ALT: 33 U/L (ref 0–44)
AST: 23 U/L (ref 15–41)
Albumin: 4.5 g/dL (ref 3.5–5.0)
Alkaline Phosphatase: 81 U/L (ref 38–126)
Anion gap: 9 (ref 5–15)
BUN: 22 mg/dL — ABNORMAL HIGH (ref 6–20)
CO2: 23 mmol/L (ref 22–32)
Calcium: 10 mg/dL (ref 8.9–10.3)
Chloride: 109 mmol/L (ref 98–111)
Creatinine, Ser: 1.11 mg/dL (ref 0.61–1.24)
GFR, Estimated: >60 mL/min (ref >60)
Glucose, Bld: 98 mg/dL (ref 70–99)
Potassium: 4.2 mmol/L (ref 3.5–5.1)
Sodium: 141 mmol/L (ref 135–145)
Total Bilirubin: 0.6 mg/dL (ref 0.3–1.2)
Total Protein: 7.8 g/dL (ref 6.5–8.1)

## 2020-10-21 LAB — RAPID URINE DRUG SCREEN, HOSP PERFORMED
Amphetamines: NOT DETECTED
Barbiturates: NOT DETECTED
Benzodiazepines: NOT DETECTED
Cocaine: NOT DETECTED
Opiates: NOT DETECTED
Tetrahydrocannabinol: NOT DETECTED

## 2020-10-21 LAB — ACETAMINOPHEN LEVEL: Acetaminophen (Tylenol), Serum: <10 ug/mL — ABNORMAL LOW (ref 10–30)

## 2020-10-21 LAB — SALICYLATE LEVEL: Salicylate Lvl: <7 mg/dL — ABNORMAL LOW (ref 7.0–30.0)

## 2020-10-21 NOTE — ED Notes (Signed)
Patient has been changed into wine-colored scrubs, wanded by security, and his belongings placed in one white bag kept under the ice machine in triage.

## 2020-10-21 NOTE — ED Notes (Signed)
[name] bag of belongings brought back to the cabinets on the 9-25 side.

## 2020-10-21 NOTE — ED Triage Notes (Signed)
BIB [employer], per mother patient is schizophrenic and not taking his medications. Police office found the patient walking in the middle of traffic.

## 2020-10-21 NOTE — ED Provider Notes (Signed)
[facility]-EMERGENCY DEPT Provider Note   CSN: [id number] Arrival date & time: 10/21/20  1844     History No chief complaint on file.  Jack Roy is a 25 y.o. male.  HPI Patient is a 25 year old male with a history of suicidal ideation, auditory hallucinations, anxiety and depression, Asperger's syndrome, intermittent explosive disorder, intellectual disability, who presents to the emergency department in [employer] custody under IVC.  Per IVC paperwork, "patient is autistic and diagnosed with schizophrenia.  He is prescribed oxcarbazepine, fluoxetine, Vistaril, as well as Invega.  Today the police found him walking down the middle of the street in traffic.  He admitted to police that he did not take his medications".  Patient appears extremely tangential.  When asked why he is in the emergency department he states "I am not confident regarding my sexuality or my gender".  He states that "sometimes he hears things that are not there" but does not provide specific details.  Denies any SI/HI to me.    Past Medical History:  Diagnosis Date   Anxiety    Asperger syndrome    Depression    Seasonal allergies     Patient Active Problem List   Diagnosis Date Noted   Intellectual disability 09/10/2020   Intermittent explosive disorder in adult 09/10/2020   Suicidal ideations 09/10/2020   Auditory hallucinations 03/05/2015   Anxiety and depression 08/22/2014   History of Asperger's syndrome 08/22/2014    Past Surgical History:  Procedure Laterality Date   WISDOM TOOTH EXTRACTION         No family history on file.  Social History   Tobacco Use   Smoking status: Never   Smokeless tobacco: Never  Substance Use Topics   Alcohol use: No   Drug use: No    Home Medications Prior to Admission medications   Medication Sig Start Date End Date Taking? Authorizing Provider  acetaminophen (TYLENOL) 325 MG tablet Take 650 mg by mouth every 6 (six) hours as needed  for mild pain, fever or headache.    [provider]  FLUoxetine (PROZAC) 40 MG capsule Take 40 mg by mouth daily.    [provider]  hydrOXYzine (VISTARIL) 25 MG capsule Take 25-50 mg by mouth See admin instructions. 25mg  in the evening 50mg  daily as needed for anxiety 08/11/20   [provider]  INVEGA SUSTENNA 156 MG/ML SUSY injection Inject 1 mL into the muscle every 30 (thirty) days. 08/10/20   [provider]  LORazepam (ATIVAN) 1 MG tablet Take 0.5-1 mg by mouth daily. 08/16/20   [provider]  oxcarbazepine (TRILEPTAL) 600 MG tablet Take 600 mg by mouth 2 (two) times daily. 04/26/20   [provider]    Allergies    Patient has no known allergies.  Review of Systems   Review of Systems  Unable to perform ROS: Psychiatric disorder   Physical Exam Updated Vital Signs There were no vitals taken for this visit.  Physical Exam Vitals and nursing note reviewed.  Constitutional:      General: He is not in acute distress.    Appearance: Normal appearance. He is not ill-appearing, toxic-appearing or diaphoretic.  HENT:     Head: Normocephalic and atraumatic.     Right Ear: External ear normal.     Left Ear: External ear normal.     Nose: Nose normal.     Mouth/Throat:     Mouth: Mucous membranes are moist.     Pharynx: Oropharynx is  clear. No oropharyngeal exudate or posterior oropharyngeal erythema.  Eyes:     Extraocular Movements: Extraocular movements intact.  Cardiovascular:     Rate and Rhythm: Normal rate and regular rhythm.     Pulses: Normal pulses.     Heart sounds: Normal heart sounds. No murmur heard.   No friction rub. No gallop.  Pulmonary:     Effort: Pulmonary effort is normal. No respiratory distress.     Breath sounds: Normal breath sounds. No stridor. No wheezing, rhonchi or rales.  Abdominal:     General: Abdomen is flat.     Tenderness: There is no abdominal tenderness.  Musculoskeletal:         General: Normal range of motion.     Cervical back: Normal range of motion and neck supple. No tenderness.  Skin:    General: Skin is warm and dry.  Neurological:     General: No focal deficit present.     Mental Status: He is alert and oriented to person, place, and time.  Psychiatric:        Attention and Perception: He perceives auditory hallucinations.        Mood and Affect: Mood normal.        Speech: Speech is tangential.        Behavior: Behavior normal.        Thought Content: Thought content includes suicidal ideation. Thought content does not include homicidal ideation.   ED Results / Procedures / Treatments   Labs (all labs ordered are listed, but only abnormal results are displayed) Labs Reviewed  RESP PANEL BY RT-PCR (FLU A&B, COVID) ARPGX2  COMPREHENSIVE METABOLIC PANEL  CBC  RAPID URINE DRUG SCREEN, HOSP PERFORMED  SALICYLATE LEVEL  ACETAMINOPHEN LEVEL   EKG None  Radiology No results found.  Procedures Procedures   Medications Ordered in ED Medications - No data to display  ED Course  I have reviewed the triage vital signs and the nursing notes.  Pertinent labs & imaging results that were available during my care of the patient were reviewed by me and considered in my medical decision making (see chart for details).    MDM Rules/Calculators/A&P                          Pt is a 25 y.o. male with a significant behavioral health history who presents to the emergency department via [employer] under IVC after being found walking through traffic.  Labs: CBC without abnormalities. CMP with a BUN of 22. Salicylate less than 7. Acetaminophen level less than 10. UDS is negative. Respiratory panel is pending.  I, Placido Sou, PA-C, personally reviewed and evaluated these images and lab results as part of my medical decision-making.  Lab work is reassuring.  Feel the patient is medically cleared at this time.  Patient will require evaluation by TTS.   Disposition pending based on TTS recommendations.  Note: Portions of this report may have been transcribed using voice recognition software. Every effort was made to ensure accuracy; however, inadvertent computerized transcription errors may be present.   Final Clinical Impression(s) / ED Diagnoses Final diagnoses:  Involuntary commitment  History of medication noncompliance  Cognitive and behavioral changes   Rx / DC Orders ED Discharge Orders     None        Placido Sou, PA-C 10/21/20 2030    Arby Barrette, MD 10/21/20 2200

## 2020-10-22 DIAGNOSIS — F6381 Intermittent explosive disorder: Secondary | ICD-10-CM

## 2020-10-22 MED ORDER — FLUOXETINE HCL 20 MG PO CAPS
40.0000 mg | ORAL_CAPSULE | Freq: Every day | ORAL | Status: DC
  Administered 2020-10-22 – 2020-10-23 (×2): 40 mg via ORAL
  Filled 2020-10-22 (×2): qty 2

## 2020-10-22 MED ORDER — OXCARBAZEPINE 300 MG PO TABS
600.0000 mg | ORAL_TABLET | Freq: Two times a day (BID) | ORAL | Status: DC
  Administered 2020-10-22 – 2020-10-23 (×2): 600 mg via ORAL
  Filled 2020-10-22 (×3): qty 2

## 2020-10-22 MED ORDER — PALIPERIDONE PALMITATE ER 234 MG/1.5ML IM SUSY
234.0000 mg | PREFILLED_SYRINGE | Freq: Once | INTRAMUSCULAR | Status: AC
  Administered 2020-10-23: 234 mg via INTRAMUSCULAR

## 2020-10-22 MED ORDER — HYDROXYZINE HCL 25 MG PO TABS
25.0000 mg | ORAL_TABLET | Freq: Three times a day (TID) | ORAL | Status: DC | PRN
  Administered 2020-10-22: 25 mg via ORAL
  Administered 2020-10-22: 50 mg via ORAL
  Filled 2020-10-22: qty 1
  Filled 2020-10-22: qty 2

## 2020-10-22 MED ORDER — LIDOCAINE VISCOUS HCL 2 % MT SOLN
15.0000 mL | Freq: Once | OROMUCOSAL | Status: AC
  Administered 2020-10-22: 15 mL via ORAL
  Filled 2020-10-22: qty 15

## 2020-10-22 MED ORDER — ZOLPIDEM TARTRATE 5 MG PO TABS
5.0000 mg | ORAL_TABLET | Freq: Every evening | ORAL | Status: DC | PRN
  Filled 2020-10-22: qty 1

## 2020-10-22 MED ORDER — ALUM & MAG HYDROXIDE-SIMETH 200-200-20 MG/5ML PO SUSP
30.0000 mL | Freq: Once | ORAL | Status: AC
  Administered 2020-10-22: 30 mL via ORAL
  Filled 2020-10-22: qty 30

## 2020-10-22 MED ORDER — PALIPERIDONE PALMITATE ER 234 MG/1.5ML IM SUSY: 234.0000 mg | PREFILLED_SYRINGE | Freq: Once | INTRAMUSCULAR | Status: DC

## 2020-10-22 MED ORDER — LORAZEPAM 1 MG PO TABS
1.0000 mg | ORAL_TABLET | Freq: Once | ORAL | Status: AC
  Administered 2020-10-22: 1 mg via ORAL
  Filled 2020-10-22: qty 1

## 2020-10-22 MED ORDER — LORAZEPAM 0.5 MG PO TABS
0.5000 mg | ORAL_TABLET | Freq: Every day | ORAL | Status: DC
  Administered 2020-10-23: 1 mg via ORAL
  Filled 2020-10-22: qty 2

## 2020-10-22 MED ORDER — PALIPERIDONE ER 6 MG PO TB24
9.0000 mg | ORAL_TABLET | Freq: Every day | ORAL | Status: DC
  Filled 2020-10-22: qty 1

## 2020-10-22 MED ORDER — ACETAMINOPHEN 325 MG PO TABS: 650.0000 mg | ORAL_TABLET | Freq: Four times a day (QID) | ORAL | Status: DC | PRN

## 2020-10-22 MED ORDER — HYDROXYZINE HCL 10 MG PO TABS
25.0000 mg | ORAL_TABLET | Freq: Two times a day (BID) | ORAL | Status: DC | PRN
  Administered 2020-10-22: 25 mg via ORAL
  Filled 2020-10-22: qty 3

## 2020-10-22 MED ORDER — PALIPERIDONE ER 6 MG PO TB24: 6.0000 mg | ORAL_TABLET | Freq: Every day | ORAL | Status: DC

## 2020-10-22 MED ORDER — PALIPERIDONE ER 6 MG PO TB24
9.0000 mg | ORAL_TABLET | Freq: Every day | ORAL | Status: DC
  Administered 2020-10-22 – 2020-10-23 (×2): 9 mg via ORAL
  Filled 2020-10-22 (×2): qty 1

## 2020-10-22 NOTE — ED Notes (Signed)
Patient was given his breakfast tray.

## 2020-10-22 NOTE — Progress Notes (Signed)
Per Leroy Sea, patient meets criteria for inpatient treatment. There are no available or appropriate beds at [facility] today. CSW faxed referrals to the following facilities for review:  [facility]  Pending - No Request Sent N/A [address] [place] [address] [phone] --  [facility]  Pending - No Request Sent N/A [address] [place] [address] [phone] --  [facility]  Pending - No Request Sent N/A [address] [place] [address] [phone] --  [facility]  Pending - No Request Sent N/A [address], [place] [address] [phone] --  [facility]  Pending - No Request Sent N/A [address] [place] [address] [phone] --  [facility]  Pending - No Request Sent N/A [address] [place] [address] [phone] --  [facility]  Pending - No Request Sent N/A [address] [place] [address] [phone] --  [facility]  Pending - No Request Sent N/A [address] [place], [place] [address] [phone] --  [facility]  Pending - No Request Sent N/A [address] [place] [address] [phone] --  [place]  Pending - No Request Sent N/A [address] [place] [address] [phone] --  [facility]  Pending - No Request Sent N/A [address], [place] [address] [phone] --  [facility]  Pending - No Request Sent N/A [address] [place] [address] [phone] --   TTS will continue to seek bed placement.  Crissie Reese, MSW, LCSW-A, LCAS-A Phone: [phone] Disposition/TOC

## 2020-10-22 NOTE — ED Notes (Signed)
Pt c/o diffuse abd pain. Dr. Silverio Lay notified. Orders placed. Meds administered

## 2020-10-22 NOTE — ED Notes (Signed)
Pt states "I don't need to be here. I want to go home." Palumbo, MD made aware

## 2020-10-22 NOTE — Consult Note (Signed)
Jack Roy is a 25 year old male with a past history of Asperger's syndrome, intermittent explosive disorder in an adult, auditory hallucinations, and suicidal ideations who presented to [facility] via [employer] under IVC by his mother stating patient is not taking his medications and was found walking in the middle of traffic. Patient denies any suicidal intent, ideations, or plan. He denies any homicidal ideations, auditory or visual hallucinations and does not appear to be actively psychotic on assessment. Patient has been observed talking to himself which mother states is his baseline.   Collateral: Vernona Rieger (mother/legal guardian) [phone] 1211: States patient was recently in short stay at [facility] for "a couple of weeks" and was not given his IM Invega injection but switched to PO Invega 9 mg daily then discharged. Patient saw primary psychiatrist day after discharge wasn't restarted on injection but had plan to restart ("2 week shot x2) at next appointment; at next follow-up but patient wouldn't get out of car. Says since discharge patient has declined. Says patient has been calling police daily and yesterday ran out of [employer] where he was found walking in the middle of traffic.  1237- mother provided medication dose as paliperidone 9 mg daily  Audie Pinto (father/legal guardian)  Confirmed that patient has been taking PO paliperidone 9 mg daily since discharge from hospital and that they prefer patient be restarted on IM [place] due to previous positive response. States patient's psychiatrist maintains injection schedule and provides medication outpatient but was unable to due to patient refusing to get out of the car.    Plan:   -Restart home medications:     -Fluoxetine 40 mg PO    -depression    -Oxcarbazepine TAB 600 mg BID    -mood stabilization      -Paliperidone (Invega Sustenna) 234 mg IM    -psychotic symptoms   -234 mg delivered intramuscularly in the deltoid  on Day  1; 156 mg delivered intramuscularly in the  deltoid on Day 8; maintenance dose should start 4 weeks after the 2nd loading injection Eppie Gibson, 2018, p. 555).     -Pt has been taking paliperidone 9 mg PO since  discharge from hospital.     -plan to bridge to IM injection   -Seek inpatient psychiatric hospitalization for further  observation, stabilization, and treatment.   Reference:    Sallee Provencal., &amp; Muntner, N. (2018). [employer]'s  essential psychopharmacology: Prescriber's Guide. [employer].

## 2020-10-22 NOTE — ED Notes (Signed)
Patient was given his lunch tray.
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
## 2020-10-22 NOTE — ED Notes (Signed)
RN and NT discussed with pt the benefits of taking his prescribed psychiatric medication. Pt expressed understanding, but said that the "voices" were telling him not to take his meds.

## 2020-10-23 NOTE — Progress Notes (Signed)
Transition of Care [facility]) - Emergency Department Mini Assessment   Patient Details  Name: Jack Roy MRN: [id number] Date of Birth: 1995-03-01  [facility]) CM/SW Contact:    Lossie Faes Tarpley-Carter, LCSWA Phone Number: 10/23/2020, 2:44 PM   Clinical Narrative: TOC CSW attempted to contact Shakil Dirk [phone], with no answer and no room on vm.  CSW then attempted to contact Audie Pinto [phone].  CSW spoke with Caryn Bee.  CSW informed Caryn Bee that the pt has been psychiatrically cleared and was ready for pick up.  Caryn Bee stated he would be here at 3:30 to pick up pt.  Tryson Lumley Tarpley-Carter, MSW, LCSW-A Pronouns:  She/Her/Hers [facility] Clinical Social Worker Direct Number:  [phone] [email or web address]    ED Mini Assessment: What brought you to the Emergency Department? : Suicidal Ideations  Barriers to Discharge: No Barriers Identified     Means of departure: Car       Patient Contact and Communications Key Contact 1: Adnan Vanvoorhis [phone] Key Contact 2: Sebastain Fishbaugh [phone] Spoke with: Audie Pinto Contact Date: 10/23/20,     Contact Phone Number: [phone] Call outcome: CSW explained that pt has been psychiatrically cleared and is ready for pick up.  Patient states their goals for this hospitalization and ongoing recovery are:: Pt is ready to return home.   Choice offered to / list presented to : NA  Admission diagnosis:  IVC Patient Active Problem List   Diagnosis Date Noted   Intellectual disability 09/10/2020   Intermittent explosive disorder in adult 09/10/2020   Suicidal ideations 09/10/2020   Auditory hallucinations 03/05/2015   Anxiety and depression 08/22/2014   History of Asperger's syndrome 08/22/2014   PCP:  [facility]:   [facility] [id number] [place], [place] - [address] [place] [address] Phone: [phone] Fax: [phone]  [id number] - [place], [place] - [address] AT [facility] [address] [facility] [address] [place] [address] Phone: [phone] Fax: [phone]

## 2020-10-23 NOTE — ED Provider Notes (Signed)
Emergency Medicine Observation Re-evaluation Note  Forrester Blando is a 25 y.o. male, seen on rounds today.  Pt initially presented to the ED for complaints of Suicidal Currently, the patient is asleep in NAD.  Physical Exam  BP 102/67 (BP Location: Left Arm)   Pulse 82   Temp (!) 97.4 F (36.3 C) (Oral)   Resp 15   Ht 5\' 2"  (1.575 m)   Wt 70.9 kg   SpO2 97%   BMI 28.61 kg/m  Physical Exam General: NAD Lungs: Respirations even and unlabored Psych: No distress  ED Course / MDM  EKG:   I have reviewed the labs performed to date as well as medications administered while in observation.  Recent changes in the last 24 hours include evaluated by TTS yesterday, 9/10.  Patient under IVC by mother as the patient had not been taking his medications was found walking in the middle of traffic.  TTS plans to seek inpatient psychiatric hospitalization.  Plan  Current plan is for waiting on social work for placement. Vencent Hauschild is under involuntary commitment.      [name], MD 10/23/20 [phone]
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
## 2020-10-23 NOTE — ED Notes (Signed)
Patient calm and cooperative with morning medications. Tolerated injection without difficulty. Denies any needs at this time. Patient asking about medications as given

## 2020-10-23 NOTE — ED Notes (Signed)
Patient using phone to call father. Patient has rapid speech but calm and cooperative.

## 2020-10-23 NOTE — ED Notes (Signed)
Patient's belongings returned. Patient dressed in personal clothes.

## 2020-10-23 NOTE — ED Notes (Signed)
Unable to to get vitals at this time. Patient resting comfortably on stretcher with eyes closed. No distress noted

## 2020-10-23 NOTE — Progress Notes (Signed)
TOC CSW received a call from Quavion Boule [phone].  Vernona Rieger stated her husband/Kevin would be picking pt up at 3:30pm when he gets off work.  Aiden Helzer Tarpley-Carter, MSW, LCSW-A Pronouns:  She/Her/Hers [facility] Clinical Social Worker Direct Number:  [phone] [email or web address]

## 2020-10-23 NOTE — ED Notes (Signed)
Custody form and paperwork given to father.

## 2020-10-23 NOTE — ED Notes (Signed)
Breakfast tray provided. 

## 2021-02-08 ENCOUNTER — Ambulatory Visit (HOSPITAL_COMMUNITY)
Admission: EM | Admit: 2021-02-08 | Discharge: 2021-02-08 | Disposition: A | Discharge status: Home / Self Care | Payer: Commercial Managed Care - PPO

## 2021-02-08 DIAGNOSIS — R45851 Suicidal ideations: Secondary | ICD-10-CM | POA: Diagnosis not present

## 2021-02-08 NOTE — ED Notes (Signed)
Pt discharged with resources in hand with follow-up recommendations for therapy and medication mgmt. Pt verbalized understanding of instructions reviewed on AVS. Safety maintained.
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
## 2021-02-08 NOTE — Discharge Instructions (Signed)

## 2021-02-20 ENCOUNTER — Telehealth (HOSPITAL_COMMUNITY): Payer: Self-pay

## 2021-02-20 NOTE — BH Assessment (Signed)
Care Management - Follow Up [facility] Discharges   Writer made contact with the patient and his mother.  Patient has a follow up appointment with his established provider Dr. Starling Manns at [facility] on 02-22-2021.

## 2021-03-03 ENCOUNTER — Encounter (HOSPITAL_COMMUNITY): Payer: Self-pay | Admitting: Emergency Medicine

## 2021-03-03 ENCOUNTER — Emergency Department (HOSPITAL_COMMUNITY)
Admission: EM | Admit: 2021-03-03 | Discharge: 2021-03-07 | Disposition: A | Discharge status: Home / Self Care | Payer: Commercial Managed Care - PPO | Attending: Emergency Medicine | Admitting: Emergency Medicine

## 2021-03-03 DIAGNOSIS — Z046 Encounter for general psychiatric examination, requested by authority: Secondary | ICD-10-CM

## 2021-03-03 DIAGNOSIS — F6381 Intermittent explosive disorder: Secondary | ICD-10-CM | POA: Diagnosis present

## 2021-03-03 DIAGNOSIS — F79 Unspecified intellectual disabilities: Secondary | ICD-10-CM

## 2021-03-03 DIAGNOSIS — F845 Asperger's syndrome: Secondary | ICD-10-CM | POA: Diagnosis present

## 2021-03-03 DIAGNOSIS — F418 Other specified anxiety disorders: Secondary | ICD-10-CM | POA: Insufficient documentation

## 2021-03-03 DIAGNOSIS — R456 Violent behavior: Secondary | ICD-10-CM | POA: Diagnosis not present

## 2021-03-03 DIAGNOSIS — R443 Hallucinations, unspecified: Secondary | ICD-10-CM

## 2021-03-03 DIAGNOSIS — R45851 Suicidal ideations: Secondary | ICD-10-CM | POA: Insufficient documentation

## 2021-03-03 DIAGNOSIS — R440 Auditory hallucinations: Secondary | ICD-10-CM | POA: Diagnosis not present

## 2021-03-03 DIAGNOSIS — R259 Unspecified abnormal involuntary movements: Secondary | ICD-10-CM | POA: Diagnosis not present

## 2021-03-03 DIAGNOSIS — R4689 Other symptoms and signs involving appearance and behavior: Secondary | ICD-10-CM | POA: Diagnosis present

## 2021-03-03 DIAGNOSIS — Z20822 Contact with and (suspected) exposure to covid-19: Secondary | ICD-10-CM | POA: Diagnosis not present

## 2021-03-03 LAB — COMPREHENSIVE METABOLIC PANEL
ALT: 33 U/L (ref 0–44)
AST: 26 U/L (ref 15–41)
Albumin: 4 g/dL (ref 3.5–5.0)
Alkaline Phosphatase: 76 U/L (ref 38–126)
Anion gap: 6 (ref 5–15)
BUN: 14 mg/dL (ref 6–20)
CO2: 22 mmol/L (ref 22–32)
Calcium: 9.4 mg/dL (ref 8.9–10.3)
Chloride: 110 mmol/L (ref 98–111)
Creatinine, Ser: 0.97 mg/dL (ref 0.61–1.24)
GFR, Estimated: >60 mL/min (ref >60)
Glucose, Bld: 100 mg/dL — ABNORMAL HIGH (ref 70–99)
Potassium: 4.4 mmol/L (ref 3.5–5.1)
Sodium: 138 mmol/L (ref 135–145)
Total Bilirubin: 0.3 mg/dL (ref 0.3–1.2)
Total Protein: 6.8 g/dL (ref 6.5–8.1)

## 2021-03-03 LAB — RAPID URINE DRUG SCREEN, HOSP PERFORMED
Amphetamines: NOT DETECTED
Barbiturates: NOT DETECTED
Benzodiazepines: POSITIVE — AB
Cocaine: NOT DETECTED
Opiates: NOT DETECTED
Tetrahydrocannabinol: NOT DETECTED

## 2021-03-03 LAB — SALICYLATE LEVEL: Salicylate Lvl: <7 mg/dL — ABNORMAL LOW (ref 7.0–30.0)

## 2021-03-03 LAB — RESP PANEL BY RT-PCR (FLU A&B, COVID) ARPGX2
Influenza A by PCR: NEGATIVE
Influenza B by PCR: NEGATIVE
SARS Coronavirus 2 by RT PCR: NEGATIVE

## 2021-03-03 LAB — CBC
HCT: 42.3 % (ref 39.0–52.0)
Hemoglobin: 14.9 g/dL (ref 13.0–17.0)
MCH: 29.6 pg (ref 26.0–34.0)
MCHC: 35.2 g/dL (ref 30.0–36.0)
MCV: 84.1 fL (ref 80.0–100.0)
Platelets: 187 10*3/uL (ref 150–400)
RBC: 5.03 MIL/uL (ref 4.22–5.81)
RDW: 12.3 % (ref 11.5–15.5)
WBC: 7.4 10*3/uL (ref 4.0–10.5)
nRBC: 0 % (ref 0.0–0.2)

## 2021-03-03 LAB — ETHANOL: Alcohol, Ethyl (B): <10 mg/dL (ref <10)

## 2021-03-03 LAB — ACETAMINOPHEN LEVEL: Acetaminophen (Tylenol), Serum: <10 ug/mL — ABNORMAL LOW (ref 10–30)

## 2021-03-03 MED ORDER — HYDROXYZINE HCL 25 MG PO TABS
25.0000 mg | ORAL_TABLET | Freq: Every day | ORAL | Status: DC | PRN
  Administered 2021-03-04 – 2021-03-05 (×2): 50 mg via ORAL
  Administered 2021-03-06: 75 mg via ORAL
  Filled 2021-03-03: qty 2
  Filled 2021-03-03: qty 3
  Filled 2021-03-03: qty 2

## 2021-03-03 MED ORDER — LORAZEPAM 1 MG PO TABS
1.0000 mg | ORAL_TABLET | Freq: Every day | ORAL | Status: DC | PRN
  Administered 2021-03-03 – 2021-03-06 (×2): 1 mg via ORAL
  Filled 2021-03-03 (×2): qty 1

## 2021-03-03 MED ORDER — FLUOXETINE HCL 20 MG PO CAPS
40.0000 mg | ORAL_CAPSULE | Freq: Every morning | ORAL | Status: DC
  Administered 2021-03-04 – 2021-03-07 (×4): 40 mg via ORAL
  Filled 2021-03-03 (×4): qty 2

## 2021-03-03 MED ORDER — OXCARBAZEPINE 300 MG PO TABS
900.0000 mg | ORAL_TABLET | Freq: Two times a day (BID) | ORAL | Status: DC
  Administered 2021-03-03 – 2021-03-07 (×7): 900 mg via ORAL
  Filled 2021-03-03 (×7): qty 3

## 2021-03-03 MED ORDER — HYDROXYZINE PAMOATE 25 MG PO CAPS: 25.0000 mg | ORAL_CAPSULE | Freq: Every day | ORAL | Status: DC | PRN

## 2021-03-03 MED ORDER — MELATONIN 5 MG PO TABS
5.0000 mg | ORAL_TABLET | Freq: Every evening | ORAL | Status: DC | PRN
  Administered 2021-03-03 – 2021-03-06 (×3): 5 mg via ORAL
  Filled 2021-03-03 (×3): qty 1

## 2021-03-03 NOTE — ED Notes (Signed)
3 copies of IVC paperwork given to RN, 1 copy in medical records, original in red folder

## 2021-03-03 NOTE — ED Notes (Signed)
Pt changed into scrubs, belongings placed in locker 6 (keys, clothes and crocs) and wanded by security.

## 2021-03-03 NOTE — ED Notes (Signed)
Blair Heys is the pt's behavioral health crisis counselor. His information and phone number is in the chart, but Mr. Jack Roy stated if you need any information about the pt or the current situation to please give him a call.
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
## 2021-03-03 NOTE — ED Provider Triage Note (Signed)
Emergency Medicine Provider Triage Evaluation Note  Jack Roy , a 26 y.o. male  was evaluated in triage.  Pt complains of IVC  Review of Systems  Positive: Hallucinations and violent behavior Negative: Fever, chlls  Physical Exam  BP (!) 168/114    Pulse 99    Temp 98.1 F (36.7 C) (Oral)    Resp 18    SpO2 98%  Gen:   Awake, no distress   Resp:  Normal effort  MSK:   Moves extremities without difficulty  Other:    Medical Decision Making  Medically screening exam initiated at 4:46 PM.  Appropriate orders placed.  Jack Roy was informed that the remainder of the evaluation will be completed by another provider, this initial triage assessment does not replace that evaluation, and the importance of remaining in the ED until their evaluation is complete.     Jack Roy, [place] 03/03/21 1647

## 2021-03-03 NOTE — ED Notes (Signed)
Pt stated he could use something for sleep and anxiety. PRN meds will be given per pt's request.

## 2021-03-03 NOTE — ED Notes (Signed)
EDP at bedside with patient.  

## 2021-03-03 NOTE — ED Provider Notes (Signed)
[facility] EMERGENCY DEPARTMENT Provider Note   CSN: [id number] Arrival date & time: 03/03/21  1601     History  Chief Complaint  Patient presents with   IVC    Jack Roy is a 26 y.o. male.  The history is provided by the patient and medical records. No language interpreter was used.  Mental Health Problem Presenting symptoms: aggressive behavior, depression, hallucinations, suicidal thoughts and suicidal threats   Presenting symptoms: no agitation (nor currently)   Patient accompanied by:  Law enforcement Degree of incapacity (severity):  Severe Timing:  Unable to specify Progression:  Worsening Chronicity:  Recurrent Relieved by:  Nothing Worsened by:  Nothing Ineffective treatments:  None tried Associated symptoms: anxiety   Associated symptoms: no abdominal pain, no appetite change, no chest pain, no fatigue and no headaches   Risk factors: hx of mental illness       Home Medications Prior to Admission medications   Medication Sig Start Date End Date Taking? Authorizing Provider  acetaminophen (TYLENOL) 325 MG tablet Take 650 mg by mouth every 6 (six) hours as needed for mild pain, fever or headache.    [provider]  FLUoxetine (PROZAC) 40 MG capsule Take 40 mg by mouth daily.    [provider]  hydrOXYzine (VISTARIL) 25 MG capsule Take 25-50 mg by mouth See admin instructions. 25mg  in the evening 50mg  daily as needed for anxiety 08/11/20   [provider]  INVEGA SUSTENNA 156 MG/ML SUSY injection Inject 1 mL into the muscle every 30 (thirty) days. 08/10/20   [provider]  LORazepam (ATIVAN) 1 MG tablet Take 0.5-1 mg by mouth daily. 08/16/20   [provider]  oxcarbazepine (TRILEPTAL) 600 MG tablet Take 600 mg by mouth 2 (two) times daily. 04/26/20   [provider]      Allergies    Patient has no known allergies.    Review of Systems   Review of Systems  Constitutional:   Negative for appetite change, chills, fatigue and fever.  HENT:  Negative for congestion.   Respiratory:  Negative for cough, chest tightness, shortness of breath and wheezing.   Cardiovascular:  Negative for chest pain.  Gastrointestinal:  Negative for abdominal pain, constipation, diarrhea, nausea and vomiting.  Genitourinary:  Negative for flank pain.  Musculoskeletal:  Negative for back pain.  Neurological:  Negative for light-headedness, numbness and headaches.  Psychiatric/Behavioral:  Positive for hallucinations and suicidal ideas. Negative for agitation (nor currently). The patient is nervous/anxious.    Physical Exam Updated Vital Signs BP (!) 168/114    Pulse 99    Temp 98.1 F (36.7 C) (Oral)    Resp 18    SpO2 98%  Physical Exam Vitals and nursing note reviewed.  Constitutional:      General: He is not in acute distress.    Appearance: He is well-developed. He is not ill-appearing, toxic-appearing or diaphoretic.  HENT:     Head: Normocephalic and atraumatic.     Mouth/Throat:     Mouth: Mucous membranes are moist.  Eyes:     Conjunctiva/sclera: Conjunctivae normal.     Pupils: Pupils are equal, round, and reactive to light.  Cardiovascular:     Rate and Rhythm: Normal rate and regular rhythm.     Heart sounds: No murmur heard. Pulmonary:     Effort: Pulmonary effort is normal. No respiratory distress.     Breath sounds: Normal breath sounds. No wheezing, rhonchi or rales.  Chest:  Chest wall: No tenderness.  Abdominal:     General: Abdomen is flat.     Palpations: Abdomen is soft.     Tenderness: There is no abdominal tenderness. There is no guarding or rebound.  Musculoskeletal:        General: No swelling or tenderness.     Cervical back: Neck supple.  Skin:    General: Skin is warm and dry.     Capillary Refill: Capillary refill takes less than 2 seconds.     Findings: No erythema.  Neurological:     General: No focal deficit present.     Mental  Status: He is alert.  Psychiatric:        Attention and Perception: He perceives auditory hallucinations.        Mood and Affect: Mood is anxious and depressed.        Thought Content: Thought content is paranoid. Thought content includes suicidal ideation. Thought content does not include homicidal ideation. Thought content includes suicidal plan.    ED Results / Procedures / Treatments   Labs (all labs ordered are listed, but only abnormal results are displayed) Labs Reviewed  COMPREHENSIVE METABOLIC PANEL - Abnormal; Notable for the following components:      Result Value   Glucose, Bld 100 (*)    All other components within normal limits  SALICYLATE LEVEL - Abnormal; Notable for the following components:   Salicylate Lvl <7.0 (*)    All other components within normal limits  ACETAMINOPHEN LEVEL - Abnormal; Notable for the following components:   Acetaminophen (Tylenol), Serum <10 (*)    All other components within normal limits  RAPID URINE DRUG SCREEN, HOSP PERFORMED - Abnormal; Notable for the following components:   Benzodiazepines POSITIVE (*)    All other components within normal limits  RESP PANEL BY RT-PCR (FLU A&B, COVID) ARPGX2  ETHANOL  CBC    EKG None  Radiology No results found.  Procedures Procedures    Medications Ordered in ED Medications  FLUoxetine (PROZAC) capsule 40 mg (has no administration in time range)  LORazepam (ATIVAN) tablet 1 mg (1 mg Oral Given 03/03/21 2202)  melatonin tablet 5 mg (5 mg Oral Given 03/03/21 2201)  Oxcarbazepine (TRILEPTAL) tablet 900 mg (900 mg Oral Given 03/03/21 2201)  hydrOXYzine (ATARAX) tablet 25-75 mg (has no administration in time range)    ED Course/ Medical Decision Making/ A&P                           Medical Decision Making Amount and/or Complexity of Data Reviewed Labs: ordered.  Risk OTC drugs. Prescription drug management.   Jack Roy is a 26 y.o. male with a past medical history  significant for anxiety, depression, Asperger's syndrome, intellectual disability, and intermittent explosive disorder who presents under IVC by family for paranoia calling 911 repeatedly, auditory hallucinations, and suicidal ideation.  According to patient, he has been hearing Marvis RepressJustin Beiber and other celebrities talking to him through his 6th sense.  He also says that he is getting more and more depressed and is getting anxious.  He reports he is "freaked out" a couple times and does that he is having suicidal thoughts.  He denied homicidal ideation to me but per IVC paperwork he has been acting out.  IVC paperwork says that his plan for suicide was to get hit by vehicles but for me he said that he would "eat a bunch of waffles with soy sauce  on them".  He otherwise denies physical complaints for me denying any current headache, neck pain, chest pain, abdominal pain, back pain, shortness of breath, fevers, chills congestion, cough, nausea, vomiting, constipation, diarrhea, or urinary symptoms.  He reports he is feeling physically well at this time.  On exam, patient is pacing around the room initially and was talking to himself.  He is redirectable and able to answer questions.  Lungs were clear and chest was nontender.  Abdomen was nontender.  Moving all extremities.  Patient is reporting current suicidal ideation.  Due to him reporting that he has suicidal ideation, has been hearing voices and has been communicated with celebrities through a paranormal sense, I do feel is reasonable to uphold IVC with first exam.  TTS consult placed.  Patient's medical clearance labs were reassuring and I feel he is medically clear at this time.  Awaiting psychiatric recommendations.  We will order home meds.  Anticipate TTS recommendations.        Final Clinical Impression(s) / ED Diagnoses Final diagnoses:  Involuntary commitment  Suicidal ideation  Hallucinations    Clinical Impression: 1.  Involuntary commitment   2. Suicidal ideation   3. Hallucinations     Disposition: Awaiting TTS recommendations.  This note was prepared with assistance of Conservation officer, historic buildings. Occasional wrong-word or sound-a-like substitutions may have occurred due to the inherent limitations of voice recognition software.      Serita Degroote, Canary Brim, MD 03/03/21 [phone]

## 2021-03-04 ENCOUNTER — Encounter (HOSPITAL_COMMUNITY): Payer: Self-pay | Admitting: Registered Nurse

## 2021-03-04 DIAGNOSIS — F845 Asperger's syndrome: Secondary | ICD-10-CM | POA: Diagnosis present

## 2021-03-04 DIAGNOSIS — R4689 Other symptoms and signs involving appearance and behavior: Secondary | ICD-10-CM | POA: Diagnosis present

## 2021-03-04 DIAGNOSIS — Z046 Encounter for general psychiatric examination, requested by authority: Secondary | ICD-10-CM

## 2021-03-04 DIAGNOSIS — R443 Hallucinations, unspecified: Secondary | ICD-10-CM | POA: Insufficient documentation

## 2021-03-04 MED ORDER — HALOPERIDOL 1 MG PO TABS
0.5000 mg | ORAL_TABLET | Freq: Two times a day (BID) | ORAL | Status: DC
  Administered 2021-03-04 – 2021-03-07 (×5): 0.5 mg via ORAL
  Filled 2021-03-04 (×6): qty 1

## 2021-03-04 MED ORDER — IBUPROFEN 400 MG PO TABS
600.0000 mg | ORAL_TABLET | Freq: Once | ORAL | Status: AC
  Administered 2021-03-04: 600 mg via ORAL
  Filled 2021-03-04: qty 1

## 2021-03-04 MED ORDER — HALOPERIDOL 0.5 MG PO TABS: 0.5000 mg | ORAL_TABLET | Freq: Two times a day (BID) | ORAL | Status: DC

## 2021-03-04 NOTE — Consult Note (Signed)
Telepsych Consultation   Reason for Consult:  IVC Referring Physician:  Tegeler, Gwenyth Allegra, MD Location of Patient: [facility] ED Location of Provider: Other: Home Office  Patient Identification: Jack Roy MRN:  [id number] Principal Diagnosis: Aggressive behavior of adult Diagnosis:  Principal Problem:   Aggressive behavior of adult Active Problems:   Auditory hallucinations   Intellectual disability   Intermittent explosive disorder in adult   Asperger syndrome   Involuntary commitment   Total Time spent with patient: 30 minutes  Subjective:   Jack Roy is a 26 yr. male with a psychiatric history of anxiety, depression, Asperger's syndrome, intellectual disability, and intermittent explosive disorder.  Patient admitted to [facility] ED under IVC petition by family with complaints of patient frequently calling 911, paranoia, auditory hallucinations, and suicidal ideation.    HPI:   Jack Roy, 26 y.o., male patient seen via tele health by this provider, consulted with Dr. Jeral Fruit; and chart reviewed on 03/04/21.  On evaluation Jack Roy reports he is at [facility] hospital because he called 911 because I was having a freak out about how my life is going.  States he is hearing music playing in his head Jack Roy, Jack Roy, and Jack Roy.  Patient reports he is depressed sometimes.  He denies suicidal ideation at this time but stats he was feeling suicidal yesterday.  Im not anymore.  I would just like to get back home and be peaceful.  Patient reporting, he had a freak-out yesterday.  When asked about paranoia patient states that yesterday I thought my dad was giving me food that I was allergic to.  But I dont now.  Patient denies homicidal ideation.   Patient reporting, he has had several psychiatric hospitalizations Ive been to [facility].  States his last psychiatric admission was at [facility] middle of last  year.  I went on my birthday.  States he was admitted related to I had a freak out sort of like now but then I was suicidal.  Patient states he has outpatient psychiatric services with Jack [facility].  Reports he tries to take his psychotropic medications as best I can but I forget some days.  My mom will help me.  States he has taking 2-3 doses of his medications prior to coming to [facility] hospital.  Patient states he lives with his mother and father who are in separate residences I go back and forth between my mom and dad.  With my dad mostly. During evaluation Jack Roy is sitting upright in chair in no acute distress.  He is alert, oriented x 3.  He is calm and cooperative.  His mood is slightly anxious but euthymic with congruent affect.  He does not appear to be responding to internal/external stimuli or delusional thoughts; but does report he is hearing music in his head.  He also denies paranoia at this time but states he thought his father was giving him food he was allergic to yesterday.  Patient is aware that he had a freak out yesterday.  Chart review notes that patient does have a history of aggressive behavior periodically.  At this time patient denies suicidal/homicidal ideation, and paranoia.  Patient answered questions appropriately.  Attempted to contact parents whom patient states both are his guardian.  Unable to reach.  TTS counselor has left voice mail for parents to call.  Unable to make disposition until speak with parents.  Will restart home medications.  Past Psychiatric History: See below  Risk to Self:   Risk to Others:   Prior Inpatient Therapy:   Prior Outpatient Therapy:    Past Medical History:  Past Medical History:  Diagnosis Date   Anxiety    Asperger syndrome    Depression    Seasonal allergies     Past Surgical History:  Procedure Laterality Date   WISDOM TOOTH EXTRACTION     Family History: History reviewed. No pertinent family history. Family  Psychiatric  History: Unaware Social History:  Social History   Substance and Sexual Activity  Alcohol Use No     Social History   Substance and Sexual Activity  Drug Use No    Social History   Socioeconomic History   Marital status: Single    Spouse name: Not on file   Number of children: Not on file   Years of education: Not on file   Highest education level: Not on file  Occupational History   Not on file  Tobacco Use   Smoking status: Never   Smokeless tobacco: Never  Substance and Sexual Activity   Alcohol use: No   Drug use: No   Sexual activity: Not on file  Other Topics Concern   Not on file  Social History Narrative   Not on file   Social Determinants of Health   Financial Resource Strain: Not on file  Food Insecurity: Not on file  Transportation Needs: Not on file  Physical Activity: Not on file  Stress: Not on file  Social Connections: Not on file   Additional Social History:    Allergies:  No Known Allergies  Labs:  Results for orders placed or performed during Jack hospital encounter of 03/03/21 (from Jack past 48 hour(s))  Comprehensive metabolic panel     Status: Abnormal   Collection Time: 03/03/21  4:22 PM  Result Value Ref Range   Sodium 138 135 - 145 mmol/L   Potassium 4.4 3.5 - 5.1 mmol/L   Chloride 110 98 - 111 mmol/L   CO2 22 22 - 32 mmol/L   Glucose, Bld 100 (H) 70 - 99 mg/dL    Comment: Glucose reference range applies only to samples taken after fasting for at least 8 hours.   BUN 14 6 - 20 mg/dL   Creatinine, Ser 0.97 0.61 - 1.24 mg/dL   Calcium 9.4 8.9 - 10.3 mg/dL   Total Protein 6.8 6.5 - 8.1 g/dL   Albumin 4.0 3.5 - 5.0 g/dL   AST 26 15 - 41 U/L   ALT 33 0 - 44 U/L   Alkaline Phosphatase 76 38 - 126 U/L   Total Bilirubin 0.3 0.3 - 1.2 mg/dL   GFR, Estimated >60 >60 mL/min    Comment: (NOTE) Calculated using Jack CKD-EPI Creatinine Equation (2021)    Anion gap 6 5 - 15    Comment: Performed at [facility] [address], [place], [place] [address]  Ethanol     Status: None   Collection Time: 03/03/21  4:22 PM  Result Value Ref Range   Alcohol, Ethyl (B) <10 <10 mg/dL    Comment: (NOTE) Lowest detectable limit for serum alcohol is 10 mg/dL.  For medical purposes only. Performed at [facility], [place] [address], [place], [place] [id number]   Salicylate level     Status: Abnormal   Collection Time: 03/03/21  4:22 PM  Result Value Ref Range   Salicylate Lvl [id number] (L) 7.0 -  30.0 mg/dL    Comment: Performed at [facility], [place] [address] [place], [place] [address]  Acetaminophen level     Status: Abnormal   Collection Time: 03/03/21  4:22 PM  Result Value Ref Range   Acetaminophen (Tylenol), Serum <10 (L) 10 - 30 ug/mL    Comment: (NOTE) Therapeutic concentrations vary significantly. A range of 10-30 ug/mL  may be an effective concentration for many patients. However, some  are best treated at concentrations outside of this range. Acetaminophen concentrations >150 ug/mL at 4 hours after ingestion  and >50 ug/mL at 12 hours after ingestion are often associated with  toxic reactions.  Performed at [facility], [place] [address], [place], [place] [address]   cbc     Status: None   Collection Time: 03/03/21  4:22 PM  Result Value Ref Range   WBC 7.4 4.0 - 10.5 K/uL   RBC 5.03 4.22 - 5.81 MIL/uL   Hemoglobin 14.9 13.0 - 17.0 g/dL   HCT 42.3 39.0 - 52.0 %   MCV 84.1 80.0 - 100.0 fL   MCH 29.6 26.0 - 34.0 pg   MCHC 35.2 30.0 - 36.0 g/dL   RDW 12.3 11.5 - 15.5 %   Platelets 187 150 - 400 K/uL   nRBC 0.0 0.0 - 0.2 %    Comment: Performed at [facility], [place] [address] [place], [place] [address]  Rapid urine drug screen (hospital performed)     Status: Abnormal   Collection Time: 03/03/21  4:33 PM  Result Value Ref Range   Opiates NONE DETECTED NONE DETECTED   Cocaine NONE DETECTED NONE DETECTED   Benzodiazepines POSITIVE (A) NONE DETECTED   Amphetamines NONE  DETECTED NONE DETECTED   Tetrahydrocannabinol NONE DETECTED NONE DETECTED   Barbiturates NONE DETECTED NONE DETECTED    Comment: (NOTE) DRUG SCREEN FOR MEDICAL PURPOSES ONLY.  IF CONFIRMATION IS NEEDED FOR ANY PURPOSE, NOTIFY LAB WITHIN 5 DAYS.  LOWEST DETECTABLE LIMITS FOR URINE DRUG SCREEN Drug Class                     Cutoff (ng/mL) Amphetamine and metabolites    1000 Barbiturate and metabolites    200 Benzodiazepine                 [id number] Tricyclics and metabolites     300 Opiates and metabolites        300 Cocaine and metabolites        300 THC                            50 Performed at [facility], [place] [address], [place], [place] [address]   Resp Panel by RT-PCR (Flu A&B, Covid) Nasopharyngeal Swab     Status: None   Collection Time: 03/03/21  7:31 PM   Specimen: Nasopharyngeal Swab; Nasopharyngeal(NP) swabs in vial transport medium  Result Value Ref Range   SARS Coronavirus 2 by RT PCR NEGATIVE NEGATIVE    Comment: (NOTE) SARS-CoV-2 target nucleic acids are NOT DETECTED.  Jack SARS-CoV-2 RNA is generally detectable in upper respiratory specimens during Jack acute phase of infection. Jack lowest concentration of SARS-CoV-2 viral copies this assay can detect is 138 copies/mL. A negative result does not preclude SARS-Cov-2 infection and should not be used as Jack sole basis for treatment or other patient management decisions. A negative result may occur with  improper  specimen collection/handling, submission of specimen other than nasopharyngeal swab, presence of viral mutation(s) within Jack areas targeted by this assay, and inadequate number of viral copies(<138 copies/mL). A negative result must be combined with clinical observations, patient history, and epidemiological information. Jack expected result is Negative.  Fact Sheet for Patients:  EntrepreneurPulse.com.au  Fact Sheet for Healthcare Providers:   IncredibleEmployment.be  This test is no t yet approved or cleared by Jack [place] FDA and  has been authorized for detection and/or diagnosis of SARS-CoV-2 by FDA under an Emergency Use Authorization (EUA). This EUA will remain  in effect (meaning this test can be used) for Jack duration of Jack COVID-19 declaration under Section 564(b)(1) of Jack Act, 21 U.S.C.section 360bbb-3(b)(1), unless Jack authorization is terminated  or revoked sooner.       Influenza A by PCR NEGATIVE NEGATIVE   Influenza B by PCR NEGATIVE NEGATIVE    Comment: (NOTE) Jack Xpert Xpress SARS-CoV-2/FLU/RSV plus assay is intended as an aid in Jack diagnosis of influenza from Nasopharyngeal swab specimens and should not be used as a sole basis for treatment. Nasal washings and aspirates are unacceptable for Xpert Xpress SARS-CoV-2/FLU/RSV testing.  Fact Sheet for Patients: EntrepreneurPulse.com.au  Fact Sheet for Healthcare Providers: IncredibleEmployment.be  This test is not yet approved or cleared by Jack [place] FDA and has been authorized for detection and/or diagnosis of SARS-CoV-2 by FDA under an Emergency Use Authorization (EUA). This EUA will remain in effect (meaning this test can be used) for Jack duration of Jack COVID-19 declaration under Section 564(b)(1) of Jack Act, 21 U.S.C. section 360bbb-3(b)(1), unless Jack authorization is terminated or revoked.  Performed at [facility], [place] [address], [place], [place] [address]     Medications:  Current Facility-Administered Medications  Medication Dose Route Frequency Provider Last Rate Last Admin   FLUoxetine (PROZAC) capsule 40 mg  40 mg Oral q AM Tegeler, Gwenyth Allegra, MD   40 mg at 03/04/21 0645   hydrOXYzine (ATARAX) tablet 25-75 mg  25-75 mg Oral Daily PRN Tegeler, Gwenyth Allegra, MD       LORazepam (ATIVAN) tablet 1 mg  1 mg Oral Daily PRN Tegeler, Gwenyth Allegra, MD   1 mg at  03/03/21 2202   melatonin tablet 5 mg  5 mg Oral QHS PRN Tegeler, Gwenyth Allegra, MD   5 mg at 03/03/21 2201   Oxcarbazepine (TRILEPTAL) tablet 900 mg  900 mg Oral BID Tegeler, Gwenyth Allegra, MD   900 mg at 03/04/21 [id number]   Current Outpatient Medications  Medication Sig Dispense Refill   acetaminophen (TYLENOL) 325 MG tablet Take 650 mg by mouth every 6 (six) hours as needed for mild pain, fever or headache.     FLUoxetine (PROZAC) 40 MG capsule Take 40 mg by mouth in Jack morning.     hydrOXYzine (VISTARIL) 25 MG capsule Take 25-75 mg by mouth daily as needed for anxiety.     hydrOXYzine (VISTARIL) 50 MG capsule Take 50 mg by mouth in Jack morning and at bedtime.     INVEGA SUSTENNA 156 MG/ML SUSY injection Inject 156 mg into Jack muscle every 30 (thirty) days.     LORazepam (ATIVAN) 1 MG tablet Take 1 mg by mouth daily as needed for anxiety.     melatonin 5 MG TABS Take 5 mg by mouth at bedtime as needed (for sleep).     oxcarbazepine (TRILEPTAL) 600 MG tablet Take 900 mg by mouth 2 (two) times daily.  Musculoskeletal: Strength & Muscle Tone: within normal limits Gait & Station: normal Patient leans: N/A          Psychiatric Specialty Exam:  Presentation  General Appearance: Appropriate for Environment  Eye Contact:Good  Speech:Clear and Coherent; Normal Rate  Speech Volume:Normal  Handedness:Right   Mood and Affect  Mood:Anxious  Affect:Congruent   Thought Process  Thought Processes:Coherent; Goal Directed  Descriptions of Associations:Intact  Orientation:Full (Time, Place and Person)  Thought Content:Logical  History of Schizophrenia/Schizoaffective disorder:No data recorded Duration of Psychotic Symptoms:No data recorded Hallucinations:Hallucinations: Auditory Description of Auditory Hallucinations: Reports he is hearing music in his head  Ideas of Reference:Paranoia (States he thought his dad was giving him food that he was allergic to.  States he  doesn't think that now)  Suicidal Thoughts:Suicidal Thoughts: No (Denies at this time)  Homicidal Thoughts:Homicidal Thoughts: No   Sensorium  Memory:Immediate Fair; Recent Fair  Judgment:Fair  Insight:Fair   Executive Functions  Concentration:Fair  Attention Span:Fair  [place]   Psychomotor Activity  Psychomotor Activity:Psychomotor Activity: Normal   Assets  Assets:Communication Skills; Desire for Improvement   Sleep  Sleep:Sleep: Good    Physical Exam: Physical Exam Vitals and nursing note reviewed. Exam conducted with a chaperone present.  Constitutional:      General: He is not in acute distress.    Appearance: Normal appearance. He is not ill-appearing.  Cardiovascular:     Rate and Rhythm: Normal rate.  Pulmonary:     Effort: Pulmonary effort is normal.  Neurological:     Mental Status: He is alert and oriented to person, place, and time.  Psychiatric:        Attention and Perception: He perceives auditory (Reports hearing music in his head) hallucinations. He does not perceive visual hallucinations.        Mood and Affect: Mood is anxious.        Speech: Speech normal.        Behavior: Behavior normal. Behavior is cooperative.        Thought Content: Paranoid: Denies at this time. Suicidal: Denies.        Judgment: Judgment is impulsive.   Review of Systems  Constitutional: Negative.        History of Asperger's syndrome, intellectual disability  HENT: Negative.    Eyes: Negative.   Respiratory: Negative.    Cardiovascular: Negative.   Gastrointestinal: Negative.   Genitourinary: Negative.   Musculoskeletal: Negative.   Skin: Negative.   Neurological: Negative.   Endo/Heme/Allergies: Negative.   Psychiatric/Behavioral:  Depression: Stable.. Hallucinations: Reports he is hearing music in his head.  Was unable to tell if music is there all Jack time or if come and go. Substance abuse: Denies.  Suicidal ideas: Denies at this time. Jack patient is nervous/anxious (Stable). Jack patient does not have insomnia.        Patient denies suicidal/homicidal ideation, and paranoia  Blood pressure 102/69, pulse 88, temperature 98.1 F (36.7 C), temperature source Oral, resp. rate 16, SpO2 99 %. There is no height or weight on file to calculate BMI.  Treatment Plan Summary: Plan Continue to monitor for safety and stabilization.  Restart home medications.  Collateral from parents before can make disposition.    Possibility that patient is at his current baseline with auditory hallucinations (hearing music in his head).  Patient impulsive behavior is most likely a result of his IDD and Asperger  syndrome.  Currently patient denies suicidal/homicidal ideation and paranoia.Marland Kitchen  Disposition:  Awaiting collateral from parents before making disposition.    This service was provided via telemedicine using a 2-way, interactive audio and video technology.  Names of all persons participating in this telemedicine service and their role in this encounter. Name: Ishani Goldwasser Role: NP  Name: Jack Roy Role: Patient  Name:  Role:   Name:  Role:    Medication Management:  Home medications restarted  FLUoxetine  40 mg Oral q AM   oxcarbazepine  900 mg Oral BID    No EKG done  Start Haldol 0.5 mg Bid for agitation and psychosis after an EKG is done and no prolong QTc  Secure message sent to patients' nurse Cyndia Diver, RN informing:  Psychiatric consult complete.  Unable to determine disposition at this time until speak with parents to see if patient is back at his baseline. TTS counselor has attempted to contact mother and father and have left voice messages.  Will continue to monitor for safety, stabilization, and medication management until collateral information gathered from parents.  Can start Haldol 0.5 mg Bid for agitation and psychosis after and EKG if no prolong QTc.  EKG was ordered.    Inform MD only default listed.    Legacy Carrender, NP 03/04/2021 2:11 PM

## 2021-03-04 NOTE — ED Notes (Signed)
Pt ambulated to restroom, pt now back in bed. Warm blanket given.

## 2021-03-05 NOTE — BH Assessment (Signed)
Per Cinderella MD collateral needs to be obtain to determine ongoing disposition for possible admission or if it is determined patient is at his baseline assist with discharge planning and OP follow up. This writer attempted to contact patient's mother Lamontae Ricardo [phone] and father Maynard  [phone] unsuccessful this date. HIPPA compliant voicemail was left.

## 2021-03-05 NOTE — ED Notes (Addendum)
Pt making first phone for the day. No answer on other line. Pt left voicemail.

## 2021-03-05 NOTE — ED Provider Notes (Signed)
Emergency Medicine Observation Re-evaluation Note  Jack Roy is a 26 y.o. male, seen on rounds today.  Pt initially presented to the ED for complaints of IVC Currently, the patient is calm.  Physical Exam  BP 92/69 (BP Location: Left Arm)    Pulse 71    Temp 97.6 F (36.4 C) (Oral)    Resp 14    SpO2 94%  Physical Exam General: awake and alert Cardiac: rrr Lungs: cta b Psych: calm  ED Course / MDM  EKG:EKG Interpretation  Date/Time:  Saturday March 04 2021 15:22:12 EST Ventricular Rate:  72 PR Interval:  174 QRS Duration: 88 QT Interval:  340 QTC Calculation: 372 R Axis:   77 Text Interpretation: Normal sinus rhythm Nonspecific T wave abnormality Abnormal ECG No previous ECGs available Confirmed by Dione Booze ([id number]) on 03/04/2021 11:33:10 PM  I have reviewed the labs performed to date as well as medications administered while in observation.  Recent changes in the last 24 hours include TTS evaluation has been done.  Pt has been calm and cooperative here.  Psych is trying to get ahold of his mother for further information, but there is no word from her yet.    Plan  Current plan is for continued observation. Jack Roy is under involuntary commitment.      Jacalyn Lefevre, MD 03/05/21 [phone]

## 2021-03-05 NOTE — ED Notes (Signed)
Patient is resting while sitter is at bedside.

## 2021-03-06 DIAGNOSIS — R443 Hallucinations, unspecified: Secondary | ICD-10-CM

## 2021-03-06 DIAGNOSIS — R4689 Other symptoms and signs involving appearance and behavior: Secondary | ICD-10-CM

## 2021-03-06 DIAGNOSIS — R440 Auditory hallucinations: Secondary | ICD-10-CM

## 2021-03-06 MED ORDER — ZIPRASIDONE MESYLATE 20 MG IM SOLR
20.0000 mg | Freq: Once | INTRAMUSCULAR | Status: AC
  Administered 2021-03-06: 20 mg via INTRAMUSCULAR
  Filled 2021-03-06: qty 20

## 2021-03-06 MED ORDER — HALOPERIDOL 0.5 MG PO TABS: 0.5000 mg | ORAL_TABLET | Freq: Two times a day (BID) | ORAL | 0 refills | Status: AC

## 2021-03-06 MED ORDER — STERILE WATER FOR INJECTION IJ SOLN
INTRAMUSCULAR | Status: AC
  Filled 2021-03-06: qty 10

## 2021-03-06 NOTE — ED Notes (Signed)
Pt in bed, NAD. Pt twitching and looking around the room. Denies SI/HI

## 2021-03-06 NOTE — ED Notes (Signed)
Pt will not comply in staying in room, stating he is cold but will not accept blankets. Pt keeps leaving room against instruction

## 2021-03-06 NOTE — ED Notes (Signed)
Pt pacing around the unit, word salad, appears and states anxiety

## 2021-03-06 NOTE — Consult Note (Signed)
Telepsych Consultation   Reason for Consult:  IVC Referring Physician:  Tegeler, Gwenyth Allegra, MD Location of Patient: [facility] ED Location of Provider: Other: [facility]  Patient Identification: Nery Dastrup MRN:  [id number] Principal Diagnosis: Aggressive behavior of adult Diagnosis:  Principal Problem:   Aggressive behavior of adult Active Problems:   Auditory hallucinations   Intellectual disability   Intermittent explosive disorder in adult   Asperger syndrome   Involuntary commitment   Total Time spent with patient: 20 minutes  Subjective:   Branch Pelly is a 72 yr. male with a psychiatric history of anxiety, depression, Asperger's syndrome, intellectual disability, and intermittent explosive disorder.  Patient admitted to [facility] ED under IVC petition by family with complaints of patient frequently calling 911, paranoia, auditory hallucinations, and suicidal ideation.    HPI:   Calyb Simonson, 26 y.o., male patient seen via tele health by this provider, consulted with Dr. Hampton Abbot; and chart reviewed on 03/06/21.  On evaluation Samul Kreyling reports he feels pretty good.  Patient reports he is eating/sleeping without any difficulty.  Reports he has been taking his medications in the hospital and has had no adverse reaction.  Patient reports he continues to have auditory hallucinations but states they are much better than they were when he first came in.  Reports that the auditory hallucinations never completely go away.  Patient denies visual hallucinations.  Patient also denies homicidal ideation and paranoia.  Patient reports that he continues to have some passive suicidal ideation and reports that it is because "I can't drive and I can't get a job.  Patient reports his main stressor is related to wanting to have more independence, wanting to get his driver's license, and wanting to find a job.  Patient reports because he cannot do those things that he freaks out.  Patient asked  what this freak out mean and he states "II started yelling and wanting to cry."  Patient reports that he made frequent calls to 911 when he was having suicidal thoughts.  Which he describes is more passive such as not wanting to wake or feeling that no one is going to miss him.  Patient is able to contract for safety stating he really does not want to die that he only wants to get his driver's license and get a job.  Patient also states that he does not have any type of day program.  Patient reported he currently has psychiatric outpatient services with [facility].  Patient reports he has spoken to his parents once since he has been in the hospital. During evaluation Chaquan Eckley is sitting up in bed in no distress.  He is alert, oriented x 4, calm and cooperative.  His mood is euthymic with congruent affect.  He does not appear to be responding to internal/external stimuli or delusional thoughts other than patient stating that he continues to hear auditory hallucinations which are less than they were when he first came into the hospital.  Patient also reporting that auditory hallucinations never really go away.  Patient denies suicidal/self-harm/homicidal ideation, psychosis, and paranoia.  Patient answered question appropriately.   Past Psychiatric History: See below  Risk to Self: Denies Risk to Others: Denies Prior Inpatient Therapy: Yes Prior Outpatient Therapy: Yes  Past Medical History:  Past Medical History:  Diagnosis Date   Anxiety    Asperger syndrome    Depression    Seasonal allergies     Past Surgical History:  Procedure Laterality Date   WISDOM  TOOTH EXTRACTION     Family History: History reviewed. No pertinent family history. Family Psychiatric  History: Unaware Social History:  Social History   Substance and Sexual Activity  Alcohol Use No     Social History   Substance and Sexual Activity  Drug Use No    Social History   Socioeconomic History   Marital  status: Single    Spouse name: Not on file   Number of children: Not on file   Years of education: Not on file   Highest education level: Not on file  Occupational History   Not on file  Tobacco Use   Smoking status: Never   Smokeless tobacco: Never  Substance and Sexual Activity   Alcohol use: No   Drug use: No   Sexual activity: Not on file  Other Topics Concern   Not on file  Social History Narrative   Not on file   Social Determinants of Health   Financial Resource Strain: Not on file  Food Insecurity: Not on file  Transportation Needs: Not on file  Physical Activity: Not on file  Stress: Not on file  Social Connections: Not on file   Additional Social History:    Allergies:  No Known Allergies  Labs:  No results found for this or any previous visit (from the past 48 hour(s)).   Medications:  Current Facility-Administered Medications  Medication Dose Route Frequency Provider Last Rate Last Admin   FLUoxetine (PROZAC) capsule 40 mg  40 mg Oral q AM Tegeler, Gwenyth Allegra, MD   40 mg at 03/06/21 [id number]   haloperidol (HALDOL) tablet 0.5 mg  0.5 mg Oral BID Malvin Johns, MD   0.5 mg at 03/06/21 [id number]   hydrOXYzine (ATARAX) tablet 25-75 mg  25-75 mg Oral Daily PRN Tegeler, Gwenyth Allegra, MD   50 mg at 03/05/21 1902   LORazepam (ATIVAN) tablet 1 mg  1 mg Oral Daily PRN Tegeler, Gwenyth Allegra, MD   1 mg at 03/03/21 2202   melatonin tablet 5 mg  5 mg Oral QHS PRN Tegeler, Gwenyth Allegra, MD   5 mg at 03/04/21 2132   Oxcarbazepine (TRILEPTAL) tablet 900 mg  900 mg Oral BID Tegeler, Gwenyth Allegra, MD   900 mg at 03/06/21 [id number]   Current Outpatient Medications  Medication Sig Dispense Refill   acetaminophen (TYLENOL) 325 MG tablet Take 650 mg by mouth every 6 (six) hours as needed for mild pain, fever or headache.     FLUoxetine (PROZAC) 40 MG capsule Take 40 mg by mouth in the morning.     hydrOXYzine (VISTARIL) 25 MG capsule Take 25-75 mg by mouth daily as needed for  anxiety.     hydrOXYzine (VISTARIL) 50 MG capsule Take 50 mg by mouth in the morning and at bedtime.     INVEGA SUSTENNA 156 MG/ML SUSY injection Inject 156 mg into the muscle every 30 (thirty) days.     LORazepam (ATIVAN) 1 MG tablet Take 1 mg by mouth daily as needed for anxiety.     melatonin 5 MG TABS Take 5 mg by mouth at bedtime as needed (for sleep).     oxcarbazepine (TRILEPTAL) 600 MG tablet Take 900 mg by mouth 2 (two) times daily.      Musculoskeletal: Strength & Muscle Tone: within normal limits Gait & Station: normal Patient leans: N/A          Psychiatric Specialty Exam:  Presentation  General Appearance: Appropriate for Environment  Eye Contact:Good  Speech:Blocked;  Clear and Coherent  Speech Volume:Normal  Handedness:Right   Mood and Affect  Mood:Euthymic  Affect:Appropriate; Congruent   Thought Process  Thought Processes:Coherent; Goal Directed; Linear  Descriptions of Associations:Circumstantial  Orientation:Full (Time, Place and Person)  Thought Content:Logical  History of Schizophrenia/Schizoaffective disorder:No data recorded Duration of Psychotic Symptoms:No data recorded Hallucinations:Hallucinations: Auditory Description of Auditory Hallucinations: Reports better   Ideas of Reference:None  Suicidal Thoughts:Suicidal Thoughts: Yes, Passive SI Passive Intent and/or Plan: Without Intent; Without Plan   Homicidal Thoughts:Homicidal Thoughts: No    Sensorium  Memory:Immediate Fair; Recent Fair  Judgment:Fair  Insight:Lacking; Fair   Community education officer  Concentration:Fair  Attention Span:Fair  [place]   Psychomotor Activity  Psychomotor Activity:Psychomotor Activity: Normal    Assets  Assets:Communication Skills; Desire for Improvement; Financial Resources/Insurance; Housing; Social Support; Transportation   Sleep  Sleep:Sleep: Good     Physical  Exam: Physical Exam Vitals and nursing note reviewed. Exam conducted with a chaperone present.  Constitutional:      General: He is not in acute distress.    Appearance: Normal appearance. He is not ill-appearing.  Cardiovascular:     Rate and Rhythm: Normal rate.  Pulmonary:     Effort: Pulmonary effort is normal.  Neurological:     Mental Status: He is alert and oriented to person, place, and time.  Psychiatric:        Attention and Perception: He perceives auditory (Improved) hallucinations. He does not perceive visual hallucinations.        Mood and Affect: Mood and affect normal.        Speech: Speech normal.        Behavior: Behavior normal. Behavior is cooperative.        Thought Content: Paranoid: Denies at this time. Suicidal: Denies.        Judgment: Judgment is impulsive.   Review of Systems  Constitutional: Negative.        History of Asperger's syndrome, intellectual disability  HENT: Negative.    Eyes: Negative.   Respiratory: Negative.    Cardiovascular: Negative.   Gastrointestinal: Negative.   Genitourinary: Negative.   Musculoskeletal: Negative.   Skin: Negative.   Neurological: Negative.   Endo/Heme/Allergies: Negative.   Psychiatric/Behavioral:  Depression: Stable.. Hallucinations: Reports auditory hallucinations never really go away; and that they have improved since admission. Substance abuse: Denies. Suicidal ideas: endoses chronic passive suicidal ideation with no plan or intent,. The patient does not have insomnia. Nervous/anxious: Stable.       Patient denies suicidal/homicidal ideation, and paranoia  Blood pressure 105/73, pulse 80, temperature 98 F (36.7 C), resp. rate 16, SpO2 98 %. There is no height or weight on file to calculate BMI.  Treatment Plan Summary: Plan Continue to monitor for safety and stabilization.  Restart home medications.  Collateral from parents before can make disposition.    Possibility that patient is at his current  baseline with auditory hallucinations (hearing music in his head).  Patient impulsive behavior is most likely a result of his IDD and Asperger  syndrome.  Currently patient denies suicidal/homicidal ideation and paranoia..    Disposition:  Awaiting collateral from parents before making disposition.    This service was provided via telemedicine using a 2-way, interactive audio and video technology.  Names of all persons participating in this telemedicine service and their role in this encounter. Name: Earleen Newport Role: NP  Name: Loralie Champagne Role: Patient  Name:  Role:   Name:  Role:  Medication Management:  Home medications restarted  FLUoxetine  40 mg Oral q AM   haloperidol  0.5 mg Oral BID   oxcarbazepine  900 mg Oral BID    Assessment: Patient has been in hospital for couple of days and continues to do well.  Patient reporting improvement in auditory hallucinations since his initial admission to ED.  Patient is eating/sleeping without any difficulty and has been taking medications without any adverse reactions.  Patient did endorses some passive suicidal ideation without any intent or plan.  He is able to contract for safety.  Patient appears to be at his baseline but have been unable to get in touch with his parents.  There have been multiple attempts of making a phone call to parents which have been unsuccessful.  Patient's nurse reported that patient has been calm/cooperative, no unsafe behaviors and has been doing fine while in the ED.  Still needing collateral information from parents.  Continue Haldol 0.5 mg Bid for agitation and psychosis   Secure message sent to patients' nurse informing: Psychiatric reassessment completed.  Patient appears to be at his baseline.  Patient reporting improvement since his initial presentation to the ED.  Can continue current medications.  Patient's parents is his guardian and no one has been able to get in contact with them since his initial  presentation to the ED.  Patient can be psychiatrically cleared once contact has been made with parents.  Since it has been hard contact the parents anyone who speaks with parents (nursing, social work) can inform the plans is to have patient psychiatrically cleared and follow-up with his current psychiatric provider: Jobie Quaker.  Patient was also started on Haldol which would help with his mood, anger, and auditory hallucinations.  Please inform MD only default listed  Sanav Remer, NP 03/06/2021 1:10 PM

## 2021-03-06 NOTE — ED Notes (Signed)
Per [facility] counselor, pt is psych cleared and to have EDP put in SW f/u consult. PA Wylder made aware

## 2021-03-06 NOTE — ED Notes (Signed)
Breakfast order placed ?

## 2021-03-06 NOTE — ED Notes (Signed)
Pt continues to escalate verbally and behaviorally

## 2021-03-06 NOTE — ED Notes (Signed)
Pt given coloring materials 

## 2021-03-06 NOTE — Discharge Instructions (Addendum)
Continue taking 0.5 mg of haldol twice daily (morning and night).

## 2021-03-06 NOTE — BH Assessment (Signed)
Disposition Counselor followed up with patients disposition by reviewing the [facility] provider [facility]" psychiatry note from today. Per the note: "Psychiatric reassessment completed.  Patient appears to be at his baseline.  Patient reporting improvement since his initial presentation to the ED.  Can continue current medications.  Patient's parents is his guardian and no one has been able to get in contact with them since his initial presentation to the ED.  Patient can be psychiatrically cleared once contact has been made with parents.  Since it has been hard contact the parents anyone who speaks with parents (nursing, social work) can inform the plans is to have patient psychiatrically cleared and follow-up with his current psychiatric provider: [facility].  Patient was also started on Haldol which would help with his mood, anger, and auditory hallucinations.  Please inform MD only default listed."  After reviewing the disposition noted, Clinician attempted to reach patient's parents/guardian. First, contacted patient's mother, Faye Sanfilippo [phone]; no answer; left a HIPPA compliant voicemail. Second, contacted patient's father, Delores Thelen, no answer, left a HIPPA compliant voicemail.   Confirmed with patient's nurse Molli Hazard, RN) that patient's have not reached out to the Emergency. Therefore, Clinician requested Mattew, RN  to contact the hospital Social Worker, to assist with contacting and coordinating patient's discharge back to her parents.

## 2021-03-06 NOTE — ED Provider Notes (Signed)
Emergency Medicine Observation Re-evaluation Note  Jack Roy is a 26 y.o. male, seen on rounds today.  Pt initially presented to the ED for complaints of IVC Currently, the patient is calm.  Physical Exam  BP 105/73    Pulse 80    Temp 98 F (36.7 C)    Resp 16    SpO2 98%  Physical Exam General: NAD Cardiac: Well perfused Lungs: Even and unlabored Psych: calm  ED Course / MDM  EKG:EKG Interpretation  Date/Time:  Saturday March 04 2021 15:22:12 EST Ventricular Rate:  72 PR Interval:  174 QRS Duration: 88 QT Interval:  340 QTC Calculation: 372 R Axis:   77 Text Interpretation: Normal sinus rhythm Nonspecific T wave abnormality Abnormal ECG No previous ECGs available Confirmed by Dione Booze ([id number]) on 03/04/2021 11:33:10 PM  I have reviewed the labs performed to date as well as medications administered while in observation.  Recent changes in the last 24 hours include TTS evaluation has been done.  Pt has been calm and cooperative here.  Psych is trying to get ahold of his mother for further information, but there is no word from her yet.    Plan  Current plan is for continued observation. Jack Roy is under involuntary commitment.        Ernie Avena, MD 03/06/21 1051

## 2021-03-06 NOTE — ED Provider Notes (Addendum)
Pt is psych cleared by NP but we have not been able to get in touch with patient's family. TOC order placed. AMION reviewed. No TOC members currently on shift.    Jack Roy [place], [place] 03/06/21 2329   I have reviewed NP note. Will prescribe haldol to patient per their recommendations BID 0.5 mg.    Jack Roy [place], [place] 03/06/21 [place], Ankit, MD 03/07/21 0120

## 2021-03-07 NOTE — ED Notes (Signed)
Pt has been psych cleared. Per Civil engineer, contracting, pt family will pick up sometime this afternoon. MD Durwin Nora given papers to rescind IVC.

## 2021-03-07 NOTE — Progress Notes (Signed)
CSW spoke with patients father Jack Roy, [phone]. Patients father stated he was not made aware that his son was cleared for discharge yesterday 03/06/21. Patients father stated his son lives with him but he is currently at work. Patients father stated he is going to try to come midday and pick up his son.

## 2021-03-07 NOTE — ED Notes (Signed)
Breakfast orders placed 

## 2021-03-07 NOTE — ED Notes (Signed)
Patient given discharge instructions, all questions answered. Patient in possession of all belongings, directed to the discharge area  

## 2021-03-07 NOTE — ED Notes (Signed)
Patient using telephone  

## 2022-03-19 ENCOUNTER — Emergency Department (HOSPITAL_COMMUNITY)
Admission: EM | Admit: 2022-03-19 | Discharge: 2022-03-19 | Discharge status: Against Medical Advice | Payer: Commercial Managed Care - PPO | Attending: Emergency Medicine | Admitting: Emergency Medicine

## 2022-03-19 ENCOUNTER — Encounter (HOSPITAL_COMMUNITY): Payer: Self-pay | Admitting: Emergency Medicine

## 2022-03-19 DIAGNOSIS — F1310 Sedative, hypnotic or anxiolytic abuse, uncomplicated: Secondary | ICD-10-CM | POA: Diagnosis not present

## 2022-03-19 DIAGNOSIS — R112 Nausea with vomiting, unspecified: Secondary | ICD-10-CM | POA: Insufficient documentation

## 2022-03-19 DIAGNOSIS — R109 Unspecified abdominal pain: Secondary | ICD-10-CM | POA: Diagnosis present

## 2022-03-19 DIAGNOSIS — Z5321 Procedure and treatment not carried out due to patient leaving prior to being seen by health care provider: Secondary | ICD-10-CM | POA: Diagnosis not present

## 2022-03-19 DIAGNOSIS — R45851 Suicidal ideations: Secondary | ICD-10-CM | POA: Insufficient documentation

## 2022-03-19 LAB — RAPID URINE DRUG SCREEN, HOSP PERFORMED
Amphetamines: NOT DETECTED
Barbiturates: NOT DETECTED
Benzodiazepines: POSITIVE — AB
Cocaine: NOT DETECTED
Opiates: NOT DETECTED
Tetrahydrocannabinol: NOT DETECTED

## 2022-03-19 NOTE — ED Notes (Signed)
Pt called someone on his phone to get him. Pt states that it is time for him to go.

## 2022-03-19 NOTE — ED Triage Notes (Signed)
Pt arriving from home with abdominal pain, n/v for a few days. Pt also endorsing suicidal ideations. Pt has history of behavioral issues.

## 2022-03-19 NOTE — ED Notes (Signed)
Pt mumbling to himself at triage desk.

## 2022-05-14 DIAGNOSIS — Z419 Encounter for procedure for purposes other than remedying health state, unspecified: Secondary | ICD-10-CM | POA: Diagnosis not present

## 2022-06-13 DIAGNOSIS — Z419 Encounter for procedure for purposes other than remedying health state, unspecified: Secondary | ICD-10-CM | POA: Diagnosis not present

## 2022-07-14 DIAGNOSIS — Z419 Encounter for procedure for purposes other than remedying health state, unspecified: Secondary | ICD-10-CM | POA: Diagnosis not present

## 2022-08-13 DIAGNOSIS — Z419 Encounter for procedure for purposes other than remedying health state, unspecified: Secondary | ICD-10-CM | POA: Diagnosis not present

## 2022-09-13 DIAGNOSIS — Z419 Encounter for procedure for purposes other than remedying health state, unspecified: Secondary | ICD-10-CM | POA: Diagnosis not present
